# Patient Record
Sex: Male | Born: 1985 | Race: Black or African American | Hispanic: No | Marital: Single | Smoking: Current every day smoker
Health system: Southern US, Community
[De-identification: ages and names within clinical notes are randomized; demographics above are authoritative.]

## PROBLEM LIST (undated history)

## (undated) DIAGNOSIS — G8929 Other chronic pain: Secondary | ICD-10-CM

## (undated) DIAGNOSIS — M545 Low back pain, unspecified: Secondary | ICD-10-CM

## (undated) DIAGNOSIS — A481 Legionnaires' disease: Secondary | ICD-10-CM

## (undated) DIAGNOSIS — K219 Gastro-esophageal reflux disease without esophagitis: Secondary | ICD-10-CM

## (undated) DIAGNOSIS — J189 Pneumonia, unspecified organism: Secondary | ICD-10-CM

---

## 2005-01-25 ENCOUNTER — Emergency Department (HOSPITAL_COMMUNITY): Admission: EM | Admit: 2005-01-25 | Discharge: 2005-01-25 | Payer: Self-pay | Admitting: Emergency Medicine

## 2005-01-30 ENCOUNTER — Emergency Department (HOSPITAL_COMMUNITY): Admission: EM | Admit: 2005-01-30 | Discharge: 2005-01-30 | Payer: Self-pay | Admitting: *Deleted

## 2005-06-15 ENCOUNTER — Emergency Department (HOSPITAL_COMMUNITY): Admission: EM | Admit: 2005-06-15 | Discharge: 2005-06-15 | Payer: Self-pay | Admitting: Emergency Medicine

## 2005-06-18 ENCOUNTER — Emergency Department (HOSPITAL_COMMUNITY): Admission: EM | Admit: 2005-06-18 | Discharge: 2005-06-18 | Payer: Self-pay | Admitting: *Deleted

## 2005-12-25 ENCOUNTER — Emergency Department (HOSPITAL_COMMUNITY): Admission: EM | Admit: 2005-12-25 | Discharge: 2005-12-25 | Payer: Self-pay | Admitting: Family Medicine

## 2007-09-13 ENCOUNTER — Emergency Department (HOSPITAL_COMMUNITY): Admission: EM | Admit: 2007-09-13 | Discharge: 2007-09-13 | Payer: Self-pay | Admitting: Family Medicine

## 2007-10-07 ENCOUNTER — Emergency Department (HOSPITAL_COMMUNITY): Admission: EM | Admit: 2007-10-07 | Discharge: 2007-10-07 | Payer: Self-pay | Admitting: Emergency Medicine

## 2008-03-22 ENCOUNTER — Emergency Department (HOSPITAL_COMMUNITY): Admission: EM | Admit: 2008-03-22 | Discharge: 2008-03-22 | Payer: Self-pay | Admitting: Emergency Medicine

## 2008-06-08 ENCOUNTER — Emergency Department (HOSPITAL_COMMUNITY): Admission: EM | Admit: 2008-06-08 | Discharge: 2008-06-08 | Payer: Self-pay | Admitting: Emergency Medicine

## 2008-06-28 ENCOUNTER — Emergency Department (HOSPITAL_COMMUNITY): Admission: EM | Admit: 2008-06-28 | Discharge: 2008-06-28 | Payer: Self-pay | Admitting: Emergency Medicine

## 2008-07-16 ENCOUNTER — Emergency Department (HOSPITAL_COMMUNITY): Admission: EM | Admit: 2008-07-16 | Discharge: 2008-07-16 | Payer: Self-pay | Admitting: Emergency Medicine

## 2008-09-06 ENCOUNTER — Emergency Department (HOSPITAL_COMMUNITY): Admission: EM | Admit: 2008-09-06 | Discharge: 2008-09-06 | Payer: Self-pay | Admitting: Emergency Medicine

## 2009-08-26 ENCOUNTER — Emergency Department (HOSPITAL_COMMUNITY): Admission: EM | Admit: 2009-08-26 | Discharge: 2009-08-26 | Payer: Self-pay | Admitting: Family Medicine

## 2009-11-07 ENCOUNTER — Emergency Department (HOSPITAL_COMMUNITY): Admission: EM | Admit: 2009-11-07 | Discharge: 2009-11-07 | Payer: Self-pay | Admitting: Emergency Medicine

## 2009-11-29 ENCOUNTER — Emergency Department (HOSPITAL_COMMUNITY): Admission: EM | Admit: 2009-11-29 | Discharge: 2009-11-29 | Payer: Self-pay | Admitting: Family Medicine

## 2010-07-15 NOTE — H&P (Signed)
Louis Hoffman, Louis Hoffman NO.:  [id number]   MEDICAL RECORD NO.:  [id number]          PATIENT TYPE:  EMS   LOCATION:  ED                           FACILITY:  [facility]   PHYSICIAN:  Karie Chimera, P.A.-C.DATE OF BIRTH:  May 03, 1985   DATE OF ADMISSION:  03/22/2008  DATE OF DISCHARGE:  03/22/2008                              HISTORY & PHYSICAL   Mr. Brawn is a pleasant 25 year old right-hand gentleman who presented  to [facility] Emergency Room for injury sustained to his left thumb  earlier this morning.  He was wrestling with his brother when he  sustained a subluxation dislocation of the left thumb.  He presented to  the [facility] Emergency Room for evaluation.  We were contacted by the  emergency room staff for consultation.  He denies any other injury.  He  recently ate at 2 p.m. while in the ER setting.   PAST MEDICAL HISTORY:  Healthy.   SURGICAL HISTORY:  None.   FAMILY HISTORY:  Noncontributory.   SOCIAL HISTORY:  He is unwed.  He has no children.  He smokes half a  pack per day.  Denies any alcohol use.  He occasionally uses marijuana.   DRUG ALLERGIES:  None.   MEDICATIONS:  None.   REVIEW OF SYSTEMS:  Negative.   PHYSICAL EXAMINATION:  CONSTITUTIONAL:  Pleasant gentleman no acute  distress.  VITAL SIGNS:  Stable.  He is afebrile.  HEENT: Within the needed limits.  CHEST:  Clear to auscultation bilaterally.  ABDOMEN:  Abdomen is soft, nontender.  Bowel sounds positive.  HEART:  S1, S2.  EXTREMITIES:  Evaluation of the left upper extremity shows he has soft  tissue swelling about the IP joint of left thumb with deformity present.  His sensation and refill are intact and DTR difficult to assess given  the subluxing dislocation present as viewed per prior radiographs.  MP  and CMC nontender.  Remaining digits are within normal limits.   Radiographs are reviewed and showed subluxation/dislocation, left thumb  IP in a posterior radial fashion.   ASSESSMENT:  Left thumb interphalangeal subluxation dislocation.   PLAN:  We discussed with the patient the need for an attempted  reduction.  If this remains unstable, possible pinning would be  recommended.  Thus, after obtaining verbal consent, digital block was  performed to the left thumb and appropriate anesthetic was obtained.  Gentle reduction was implemented.  The patient was reduced without  difficulties and appeared stable.  Thus, he was placed in the dorsal  blocking splint and a radial blocking splint followed by casting.  Post  reduction films showed reduction of the IP joint.   We discussed with him diligent elevation, keeping his cast clean and dry  and intact, and not giving this wet.  In 1 week he will call us for  repeat radiographs and follow up this in 1 week at [phone].  We have  written for Vicodin 5/500 one to two p.o. 4 to 6 p.r.n. pain #30.  All  questions were encouraged and answered.      Karie Chimera, P.A.-C.  BB/MEDQ  D:  03/22/2008  T:  03/22/2008  Job:  [id number]   cc:   Dionne Ano. Amanda Pea, M.D.  Fax: [phone]

## 2013-07-09 ENCOUNTER — Encounter (HOSPITAL_COMMUNITY): Payer: Self-pay | Admitting: Emergency Medicine

## 2013-07-09 ENCOUNTER — Emergency Department (HOSPITAL_COMMUNITY)
Admission: EM | Admit: 2013-07-09 | Discharge: 2013-07-09 | Disposition: A | Discharge status: Home / Self Care | Payer: Self-pay | Attending: Emergency Medicine | Admitting: Emergency Medicine

## 2013-07-09 DIAGNOSIS — J020 Streptococcal pharyngitis: Secondary | ICD-10-CM | POA: Insufficient documentation

## 2013-07-09 DIAGNOSIS — F17200 Nicotine dependence, unspecified, uncomplicated: Secondary | ICD-10-CM | POA: Insufficient documentation

## 2013-07-09 MED ORDER — METHYLPREDNISOLONE SODIUM SUCC 125 MG IJ SOLR
125.0000 mg | Freq: Once | INTRAMUSCULAR | Status: AC
  Administered 2013-07-09: 125 mg via INTRAMUSCULAR
  Filled 2013-07-09: qty 2

## 2013-07-09 MED ORDER — PENICILLIN G BENZATHINE 1200000 UNIT/2ML IM SUSP
1.2000 10*6.[IU] | Freq: Once | INTRAMUSCULAR | Status: AC
  Administered 2013-07-09: 1.2 10*6.[IU] via INTRAMUSCULAR
  Filled 2013-07-09: qty 2

## 2013-07-09 NOTE — ED Notes (Signed)
Pt c/o sore throat x 3 days, coughing up a lot of spit per pt.

## 2013-07-09 NOTE — Discharge Instructions (Signed)
You have been treated for step throat.  Symptoms should start to improve in the next few days. May use over the counter chloraseptic spray and/or salt water gargles to help with pain. Return to the ED for new concerns.

## 2013-07-09 NOTE — ED Provider Notes (Signed)
Medical screening examination/treatment/procedure(s) were performed by non-physician practitioner and as supervising physician I was immediately available for consultation/collaboration.   EKG Interpretation None        Lekeya Rollings, MD 07/09/13 1413 

## 2013-07-09 NOTE — ED Provider Notes (Signed)
CSN: [id number]     Arrival date & time 07/09/13  1218 History  This chart was scribed for non-physician practitioner, Sharilyn SitesLisa Jumaane Weatherford, PA-C,working with Rolan BuccoMelanie Belfi, MD, by Karle PlumberJennifer Tensley, ED Scribe.  This patient was seen in room WTR6/WTR6 and the patient's care was started at 12:41 PM.  Chief Complaint  Patient presents with  . Sore Throat   The history is provided by the patient. No language interpreter was used.   HPI Comments:  Louis FantasiaJohnnie Hoffman is a 28 y.o. male who presents to the Emergency Department complaining of sore throat, cold sweats, and pain with swallowing. He reports associated subjective fever, cough, rhinorrhea, and nasal congestion. He states he has not had any sick contacts to his knowledge. He denies chills, difficulty swallowing, or difficulty breathing.   History reviewed. No pertinent past medical history. History reviewed. No pertinent past surgical history. No family history on file. History  Substance Use Topics  . Smoking status: Current Every Day Smoker  . Smokeless tobacco: Not on file  . Alcohol Use: Yes    Review of Systems  Constitutional: Positive for fever (subjective). Negative for chills.  HENT: Positive for congestion, rhinorrhea and sore throat. Negative for trouble swallowing.   Respiratory: Positive for cough. Negative for shortness of breath.   All other systems reviewed and are negative.   Allergies  Review of patient's allergies indicates no known allergies.  Home Medications   Prior to Admission medications   Not on File   Triage Vitals: BP 140/96  Pulse 93  Temp(Src) 98.9 F (37.2 C) (Oral)  Resp 16  SpO2 98% Physical Exam  Nursing note and vitals reviewed. Constitutional: He is oriented to person, place, and time. He appears well-developed and well-nourished.  HENT:  Head: Normocephalic and atraumatic.  Mouth/Throat: Uvula is midline and mucous membranes are normal. Posterior oropharyngeal erythema present.  Tonsils 2+  bilaterally with exudate. Uvula midline. No peritonsillar abscess. Handling secretions appropriately.  No difficulty swallowing or speaking.  Phonation normal  Eyes: Conjunctivae and EOM are normal. Pupils are equal, round, and reactive to light.  Neck: Trachea normal, normal range of motion and phonation normal.  Cardiovascular: Normal rate, regular rhythm and normal heart sounds.   Pulmonary/Chest: Effort normal and breath sounds normal.  Abdominal: Soft. Bowel sounds are normal.  Musculoskeletal: Normal range of motion.  Lymphadenopathy:    He has cervical adenopathy (anterior chains bilaterally).  Neurological: He is alert and oriented to person, place, and time.  Skin: Skin is warm and dry.  Psychiatric: He has a normal mood and affect.    ED Course  Procedures (including critical care time) DIAGNOSTIC STUDIES: Oxygen Saturation is 98% on RA, normal by my interpretation.   COORDINATION OF CARE: 12:43 PM- Will give PCN injection for strep throat. Pt verbalizes understanding and agrees to plan.  Medications - No data to display  Labs Review Labs Reviewed - No data to display  Imaging Review No results found.   EKG Interpretation None      MDM   Final diagnoses:  Strep pharyngitis   Sign/sx consistent with streph pharyngitis.  Pt is currently afebrile and non-toxic appearing.  His airway is patent and there are no signs/sx concerning for PTA at this time.  Pt tx in ED with solu-medrol and bicillin.  Advised may use OTC chloraseptic spray and/or salt water gargles to help with throat pain.  Discussed plan with patient, he/she acknowledged understanding and agreed with plan of care.  Return precautions given  for new or worsening symptoms.  I personally performed the services described in this documentation, which was scribed in my presence. The recorded information has been reviewed and is accurate.  Garlon HatchetLisa M Sima Lindenberger, PA-C 07/09/13 1323

## 2015-02-07 ENCOUNTER — Encounter (HOSPITAL_COMMUNITY): Payer: Self-pay | Admitting: *Deleted

## 2015-02-07 ENCOUNTER — Emergency Department (HOSPITAL_COMMUNITY)
Admission: EM | Admit: 2015-02-07 | Discharge: 2015-02-07 | Disposition: A | Discharge status: Home / Self Care | Payer: Self-pay | Attending: Emergency Medicine | Admitting: Emergency Medicine

## 2015-02-07 DIAGNOSIS — A64 Unspecified sexually transmitted disease: Secondary | ICD-10-CM | POA: Insufficient documentation

## 2015-02-07 DIAGNOSIS — F17210 Nicotine dependence, cigarettes, uncomplicated: Secondary | ICD-10-CM | POA: Insufficient documentation

## 2015-02-07 DIAGNOSIS — R2232 Localized swelling, mass and lump, left upper limb: Secondary | ICD-10-CM | POA: Insufficient documentation

## 2015-02-07 DIAGNOSIS — R61 Generalized hyperhidrosis: Secondary | ICD-10-CM | POA: Insufficient documentation

## 2015-02-07 MED ORDER — FLUORESCEIN SODIUM 1 MG OP STRP
1.0000 | ORAL_STRIP | Freq: Once | OPHTHALMIC | Status: AC
  Administered 2015-02-07: 1 via OPHTHALMIC
  Filled 2015-02-07: qty 1

## 2015-02-07 MED ORDER — PROPARACAINE HCL 0.5 % OP SOLN
1.0000 [drp] | Freq: Once | OPHTHALMIC | Status: AC
  Administered 2015-02-07: 1 [drp] via OPHTHALMIC
  Filled 2015-02-07: qty 15

## 2015-02-07 MED ORDER — ACYCLOVIR 400 MG PO TABS: 800.0000 mg | ORAL_TABLET | Freq: Two times a day (BID) | ORAL | Status: DC

## 2015-02-07 NOTE — ED Notes (Signed)
Declined W/C at D/C and was escorted to lobby by RN. 

## 2015-02-07 NOTE — ED Notes (Signed)
SEE PA assessment 

## 2015-02-07 NOTE — Discharge Instructions (Signed)
Please take your medications as prescribed. It is important to inform your sexual partners be retreated for STI today. Follow-up at the health department for further evaluation mention of your symptoms as well as follow-up on STI testing. Return to ED for any new or worsening symptoms.  Sexually Transmitted Disease A sexually transmitted disease (STD) is a disease or infection that may be passed (transmitted) from person to person, usually during sexual activity. This may happen by way of saliva, semen, blood, vaginal mucus, or urine. Common STDs include:  Gonorrhea.  Chlamydia.  Syphilis.  HIV and AIDS.  Genital herpes.  Hepatitis B and C.  Trichomonas.  Human papillomavirus (HPV).  Pubic lice.  Scabies.  Mites.  Bacterial vaginosis. WHAT ARE CAUSES OF STDs? An STD may be caused by bacteria, a virus, or parasites. STDs are often transmitted during sexual activity if one person is infected. However, they may also be transmitted through nonsexual means. STDs may be transmitted after:   Sexual intercourse with an infected person.  Sharing sex toys with an infected person.  Sharing needles with an infected person or using unclean piercing or tattoo needles.  Having intimate contact with the genitals, mouth, or rectal areas of an infected person.  Exposure to infected fluids during birth. WHAT ARE THE SIGNS AND SYMPTOMS OF STDs? Different STDs have different symptoms. Some people may not have any symptoms. If symptoms are present, they may include:  Painful or bloody urination.  Pain in the pelvis, abdomen, vagina, anus, throat, or eyes.  A skin rash, itching, or irritation.  Growths, ulcerations, blisters, or sores in the genital and anal areas.  Abnormal vaginal discharge with or without bad odor.  Penile discharge in men.  Fever.  Pain or bleeding during sexual intercourse.  Swollen glands in the groin area.  Yellow skin and eyes (jaundice). This is seen  with hepatitis.  Swollen testicles.  Infertility.  Sores and blisters in the mouth. HOW ARE STDs DIAGNOSED? To make a diagnosis, your health care provider may:  Take a medical history.  Perform a physical exam.  Take a sample of any discharge to examine.  Swab the throat, cervix, opening to the penis, rectum, or vagina for testing.  Test a sample of your first morning urine.  Perform blood tests.  Perform a Pap test, if this applies.  Perform a colposcopy.  Perform a laparoscopy. HOW ARE STDs TREATED? Treatment depends on the STD. Some STDs may be treated but not cured.  Chlamydia, gonorrhea, trichomonas, and syphilis can be cured with antibiotic medicine.  Genital herpes, hepatitis, and HIV can be treated, but not cured, with prescribed medicines. The medicines lessen symptoms.  Genital warts from HPV can be treated with medicine or by freezing, burning (electrocautery), or surgery. Warts may come back.  HPV cannot be cured with medicine or surgery. However, abnormal areas may be removed from the cervix, vagina, or vulva.  If your diagnosis is confirmed, your recent sexual partners need treatment. This is true even if they are symptom-free or have a negative culture or evaluation. They should not have sex until their health care providers say it is okay.  Your health care provider may test you for infection again 3 months after treatment. HOW CAN I REDUCE MY RISK OF GETTING AN STD? Take these steps to reduce your risk of getting an STD:  Use latex condoms, dental dams, and water-soluble lubricants during sexual activity. Do not use petroleum jelly or oils.  Avoid having multiple sex  partners.  Do not have sex with someone who has other sex partners  Do not have sex with anyone you do not know or who is at high risk for an STD.  Avoid risky sex practices that can break your skin.  Do not have sex if you have open sores on your mouth or skin.  Avoid drinking too  much alcohol or taking illegal drugs. Alcohol and drugs can affect your judgment and put you in a vulnerable position.  Avoid engaging in oral and anal sex acts.  Get vaccinated for HPV and hepatitis. If you have not received these vaccines in the past, talk to your health care provider about whether one or both might be right for you.  If you are at risk of being infected with HIV, it is recommended that you take a prescription medicine daily to prevent HIV infection. This is called pre-exposure prophylaxis (PrEP). You are considered at risk if:  You are a man who has sex with other men (MSM).  You are a heterosexual man or woman and are sexually active with more than one partner.  You take drugs by injection.  You are sexually active with a partner who has HIV.  Talk with your health care provider about whether you are at high risk of being infected with HIV. If you choose to begin PrEP, you should first be tested for HIV. You should then be tested every 3 months for as long as you are taking PrEP. WHAT SHOULD I DO IF I THINK I HAVE AN STD?  See your health care provider.  Tell your sexual partner(s). They should be tested and treated for any STDs.  Do not have sex until your health care provider says it is okay. WHEN SHOULD I GET IMMEDIATE MEDICAL CARE? Contact your health care provider right away if:   You have severe abdominal pain.  You are a man and notice swelling or pain in your testicles.  You are a woman and notice swelling or pain in your vagina.   This information is not intended to replace advice given to you by your health care provider. Make sure you discuss any questions you have with your health care provider.   Document Released: 05/09/2002 Document Revised: 03/09/2014 Document Reviewed: 09/06/2012 Elsevier Interactive Patient Education [employer]2016 Elsevier Inc.

## 2015-02-07 NOTE — ED Notes (Signed)
Pt reports he has a rash to groin area and general body. Pt reports he has night sweats  for one week.

## 2015-02-07 NOTE — ED Provider Notes (Signed)
CSN: [id number]     Arrival date & time 02/07/15  9562 History  By signing my name below, I, Doreatha Martin, attest that this documentation has been prepared under the direction and in the presence of  Joycie Peek, PA-C. Electronically Signed: Doreatha Martin, ED Scribe. 02/07/2015. 10:14 AM.    Chief Complaint  Patient presents with  . Rash   The history is provided by the patient. No language interpreter was used.    HPI Comments: Louis Hoffman is a 29 y.o. male who presents to the Emergency Department complaining of intermittent, pruritic, burning painful rash to the BUE and groin onset 2 days ago with associated night sweats for 2 weeks, nonpainful area of swelling to the left wrist. He also notes a small area of rash proximal to his left eye and a throbbing sensation to his arms where the rash is located. Pt states no new recent sexual partners. Pt states that he has tried Benadryl with no relief of rash. Pt states h/o similar rash. He denies rash to the palm of the hands or soles of the feet, penile discharge, penile pain, abdominal pain, SOB, lymph node swelling.   History reviewed. No pertinent past medical history. History reviewed. No pertinent past surgical history. History reviewed. No pertinent family history. Social History  Substance Use Topics  . Smoking status: Current Every Day Smoker  . Smokeless tobacco: None  . Alcohol Use: Yes    Review of Systems A 10 point review of systems was completed and was negative except for pertinent positives and negatives as mentioned in the history of present illness.   Allergies  Review of patient's allergies indicates no known allergies.  Home Medications   Prior to Admission medications   Medication Sig Start Date End Date Taking? Authorizing Provider  DM-Phenylephrine-Acetaminophen (VICKS DAYQUIL COLD & FLU) 10-5-325 MG CAPS Take 2 capsules by mouth every 6 (six) hours as needed (for cold).   Yes Historical Provider, MD  ibuprofen  (ADVIL,MOTRIN) 200 MG tablet Take 400-600 mg by mouth every 6 (six) hours as needed for fever or moderate pain.   Yes Historical Provider, MD  acyclovir (ZOVIRAX) 400 MG tablet Take 2 tablets (800 mg total) by mouth 2 (two) times daily. 02/07/15   Yazid Pop, PA-C   BP 146/88 mmHg  Pulse 72  Temp(Src) 98 F (36.7 C) (Oral)  Resp 16  Wt 113.399 kg  SpO2 99% Physical Exam  Constitutional: He is oriented to person, place, and time. He appears well-developed and well-nourished.  Awake, alert, nontoxic appearance.    HENT:  Head: Normocephalic and atraumatic.  Eyes: Conjunctivae and EOM are normal. Pupils are equal, round, and reactive to light.  Small lesion near the medial canthus of left eyelid. Nondraining, slightly erosive, No dendritic lesions or other abnormalities noted on fluorescein staining  Neck: Normal range of motion. Neck supple.  Cardiovascular: Normal rate.   Pulmonary/Chest: Effort normal. No respiratory distress.  Abdominal: He exhibits no distension.  Genitourinary:  Chaperone present throughout entire exam.  3 small areas of lightly erythematous versus skin colored lesions. Papular and appears slightly erosive. No obvious vesicles. No lesions to the glans. No inguinal lymphadenopathy  Musculoskeletal: Normal range of motion.  Neurological: He is alert and oriented to person, place, and time.  Skin: Skin is warm and dry. Rash noted.  Non-tender nodule on dorsum of left wrist. Non pulsatile. No erythema. No fluctuance.  Several other sparsely located lesion similar to lesions on genitalia. No rashes to  mucosa, palms or soles.  Psychiatric: He has a normal mood and affect. His behavior is normal.  Nursing note and vitals reviewed.  ED Course  Procedures (including critical care time) DIAGNOSTIC STUDIES: Oxygen Saturation is 96% on RA, adequate by my interpretation.    COORDINATION OF CARE: 10:09 AM Discussed treatment plan with pt at bedside and pt agreed to  plan. Plan for fluorescein staining, urinalysis and treatment for STI and advised f/u with the health department.    Labs Review Labs Reviewed  RPR  HIV ANTIBODY (ROUTINE TESTING)    I have personally reviewed and evaluated these lab results as part of my medical decision-making.  Meds given in ED:  Medications  fluorescein ophthalmic strip 1 strip (1 strip Right Eye Given 02/07/15 1034)  proparacaine (ALCAINE) 0.5 % ophthalmic solution 1 drop (1 drop Both Eyes Given 02/07/15 1051)    Discharge Medication List as of 02/07/2015 11:10 AM    START taking these medications   Details  acyclovir (ZOVIRAX) 400 MG tablet Take 2 tablets (800 mg total) by mouth 2 (two) times daily., Starting 02/07/2015, Until Discontinued, Print         Filed Vitals:   02/07/15 0954 02/07/15 1113  BP: 148/93 146/88  Pulse: 79 72  Temp: 97.5 F (36.4 C) 98 F (36.7 C)  Resp: 17 16    MDM   Final diagnoses:  STI (sexually transmitted infection)   Patient with rash to BUE and groin without penile discharge or pain. Patient treated in the ED for STI with acyclovir. Also performed fluorescein stain exam for herpetic lesions in left eye. Negative. Patient advised to f/u with health department for further evaluation of symptoms and future STI check. Patient advised to inform and treat all sexual partners.  Pt advised on safe sex practices and understands.  HIV and RPR sent. No concern for prostatitis or epididymitis. Discussed return precautions. Pt appears safe for discharge.    I personally performed the services described in this documentation, which was scribed in my presence. The recorded information has been reviewed and is accurate.   Joycie PeekBenjamin Rayna Brenner, PA-C 02/07/15 1559  Alvira MondayErin Schlossman, MD 02/08/15 [phone]

## 2015-02-08 LAB — RPR: RPR: NONREACTIVE

## 2015-02-08 LAB — HIV ANTIBODY (ROUTINE TESTING W REFLEX): HIV Screen 4th Generation wRfx: NONREACTIVE

## 2016-08-10 ENCOUNTER — Emergency Department (HOSPITAL_COMMUNITY): Payer: Self-pay

## 2016-08-10 ENCOUNTER — Inpatient Hospital Stay (HOSPITAL_COMMUNITY)
Admission: EM | Admit: 2016-08-10 | Discharge: 2016-08-13 | DRG: 871 | Discharge status: Against Medical Advice | Payer: Self-pay | Attending: Family Medicine | Admitting: Family Medicine

## 2016-08-10 ENCOUNTER — Encounter (HOSPITAL_COMMUNITY): Payer: Self-pay

## 2016-08-10 DIAGNOSIS — F17210 Nicotine dependence, cigarettes, uncomplicated: Secondary | ICD-10-CM | POA: Diagnosis present

## 2016-08-10 DIAGNOSIS — R55 Syncope and collapse: Secondary | ICD-10-CM | POA: Diagnosis present

## 2016-08-10 DIAGNOSIS — Z79899 Other long term (current) drug therapy: Secondary | ICD-10-CM

## 2016-08-10 DIAGNOSIS — J189 Pneumonia, unspecified organism: Secondary | ICD-10-CM

## 2016-08-10 DIAGNOSIS — E876 Hypokalemia: Secondary | ICD-10-CM | POA: Diagnosis present

## 2016-08-10 DIAGNOSIS — F1210 Cannabis abuse, uncomplicated: Secondary | ICD-10-CM

## 2016-08-10 DIAGNOSIS — D72829 Elevated white blood cell count, unspecified: Secondary | ICD-10-CM

## 2016-08-10 DIAGNOSIS — J181 Lobar pneumonia, unspecified organism: Secondary | ICD-10-CM

## 2016-08-10 DIAGNOSIS — A481 Legionnaires' disease: Secondary | ICD-10-CM | POA: Diagnosis present

## 2016-08-10 DIAGNOSIS — Z6841 Body Mass Index (BMI) 40.0 and over, adult: Secondary | ICD-10-CM

## 2016-08-10 DIAGNOSIS — A419 Sepsis, unspecified organism: Principal | ICD-10-CM

## 2016-08-10 DIAGNOSIS — E871 Hypo-osmolality and hyponatremia: Secondary | ICD-10-CM

## 2016-08-10 DIAGNOSIS — M549 Dorsalgia, unspecified: Secondary | ICD-10-CM

## 2016-08-10 DIAGNOSIS — R1110 Vomiting, unspecified: Secondary | ICD-10-CM

## 2016-08-10 DIAGNOSIS — R17 Unspecified jaundice: Secondary | ICD-10-CM | POA: Diagnosis present

## 2016-08-10 DIAGNOSIS — Z8249 Family history of ischemic heart disease and other diseases of the circulatory system: Secondary | ICD-10-CM

## 2016-08-10 DIAGNOSIS — M545 Low back pain: Secondary | ICD-10-CM | POA: Diagnosis present

## 2016-08-10 DIAGNOSIS — Z720 Tobacco use: Secondary | ICD-10-CM

## 2016-08-10 HISTORY — DX: Pneumonia, unspecified organism: J18.9

## 2016-08-10 LAB — LIPASE, BLOOD: Lipase: 20 U/L (ref 11–51)

## 2016-08-10 LAB — CBC
HCT: 38.1 % — ABNORMAL LOW (ref 39.0–52.0)
HEMOGLOBIN: 13.3 g/dL (ref 13.0–17.0)
MCH: 30.8 pg (ref 26.0–34.0)
MCHC: 34.9 g/dL (ref 30.0–36.0)
MCV: 88.2 fL (ref 78.0–100.0)
Platelets: 172 10*3/uL (ref 150–400)
RBC: 4.32 MIL/uL (ref 4.22–5.81)
RDW: 12.2 % (ref 11.5–15.5)
WBC: 19.1 10*3/uL — ABNORMAL HIGH (ref 4.0–10.5)

## 2016-08-10 LAB — URINALYSIS, ROUTINE W REFLEX MICROSCOPIC
Bacteria, UA: NONE SEEN
Bilirubin Urine: NEGATIVE
GLUCOSE, UA: NEGATIVE mg/dL
HGB URINE DIPSTICK: NEGATIVE
KETONES UR: NEGATIVE mg/dL
Leukocytes, UA: NEGATIVE
NITRITE: NEGATIVE
PROTEIN: 100 mg/dL — AB
Specific Gravity, Urine: 1.027 (ref 1.005–1.030)
pH: 5 (ref 5.0–8.0)

## 2016-08-10 LAB — INFLUENZA PANEL BY PCR (TYPE A & B)
INFLBPCR: NEGATIVE
Influenza A By PCR: NEGATIVE

## 2016-08-10 LAB — COMPREHENSIVE METABOLIC PANEL
ALK PHOS: 95 U/L (ref 38–126)
ALT: 47 U/L (ref 17–63)
ANION GAP: 12 (ref 5–15)
AST: 39 U/L (ref 15–41)
Albumin: 3.8 g/dL (ref 3.5–5.0)
BILIRUBIN TOTAL: 3.1 mg/dL — AB (ref 0.3–1.2)
BUN: 9 mg/dL (ref 6–20)
CALCIUM: 8.9 mg/dL (ref 8.9–10.3)
CO2: 19 mmol/L — ABNORMAL LOW (ref 22–32)
Chloride: 101 mmol/L (ref 101–111)
Creatinine, Ser: 1.2 mg/dL (ref 0.61–1.24)
GFR calc non Af Amer: >60 mL/min (ref >60)
Glucose, Bld: 133 mg/dL — ABNORMAL HIGH (ref 65–99)
Potassium: 3.2 mmol/L — ABNORMAL LOW (ref 3.5–5.1)
SODIUM: 132 mmol/L — AB (ref 135–145)
TOTAL PROTEIN: 7.3 g/dL (ref 6.5–8.1)

## 2016-08-10 LAB — I-STAT CG4 LACTIC ACID, ED: Lactic Acid, Venous: 0.83 mmol/L (ref 0.5–1.9)

## 2016-08-10 LAB — PROCALCITONIN: Procalcitonin: 0.25 ng/mL

## 2016-08-10 LAB — RAPID URINE DRUG SCREEN, HOSP PERFORMED
Amphetamines: NOT DETECTED
Barbiturates: NOT DETECTED
Benzodiazepines: NOT DETECTED
Cocaine: NOT DETECTED
OPIATES: NOT DETECTED
Tetrahydrocannabinol: POSITIVE — AB

## 2016-08-10 LAB — ETHANOL: Alcohol, Ethyl (B): <5 mg/dL (ref <5)

## 2016-08-10 LAB — STREP PNEUMONIAE URINARY ANTIGEN: Strep Pneumo Urinary Antigen: NEGATIVE

## 2016-08-10 LAB — LACTIC ACID, PLASMA: LACTIC ACID, VENOUS: 0.9 mmol/L (ref 0.5–1.9)

## 2016-08-10 MED ORDER — ONDANSETRON HCL 4 MG/2ML IJ SOLN
4.0000 mg | Freq: Once | INTRAMUSCULAR | Status: AC
  Administered 2016-08-10: 4 mg via INTRAVENOUS
  Filled 2016-08-10: qty 2

## 2016-08-10 MED ORDER — ACETAMINOPHEN 650 MG RE SUPP: 650.0000 mg | Freq: Four times a day (QID) | RECTAL | Status: DC | PRN

## 2016-08-10 MED ORDER — ONDANSETRON HCL 4 MG/2ML IJ SOLN
4.0000 mg | Freq: Four times a day (QID) | INTRAMUSCULAR | Status: DC | PRN
  Administered 2016-08-10: 4 mg via INTRAVENOUS
  Filled 2016-08-10: qty 2

## 2016-08-10 MED ORDER — VANCOMYCIN HCL IN DEXTROSE 1-5 GM/200ML-% IV SOLN: 1000.0000 mg | Freq: Once | INTRAVENOUS | Status: DC

## 2016-08-10 MED ORDER — VANCOMYCIN HCL IN DEXTROSE 1-5 GM/200ML-% IV SOLN
1000.0000 mg | Freq: Two times a day (BID) | INTRAVENOUS | Status: DC
  Administered 2016-08-11: 1000 mg via INTRAVENOUS
  Filled 2016-08-10 (×2): qty 200

## 2016-08-10 MED ORDER — SODIUM CHLORIDE 0.9 % IV BOLUS (SEPSIS)
1000.0000 mL | Freq: Once | INTRAVENOUS | Status: AC
  Administered 2016-08-10: 1000 mL via INTRAVENOUS

## 2016-08-10 MED ORDER — ACETAMINOPHEN 500 MG PO TABS
500.0000 mg | ORAL_TABLET | Freq: Once | ORAL | Status: AC
  Administered 2016-08-10: 500 mg via ORAL
  Filled 2016-08-10: qty 1

## 2016-08-10 MED ORDER — PIPERACILLIN-TAZOBACTAM 3.375 G IVPB 30 MIN
3.3750 g | Freq: Once | INTRAVENOUS | Status: AC
  Administered 2016-08-10: 3.375 g via INTRAVENOUS
  Filled 2016-08-10: qty 50

## 2016-08-10 MED ORDER — HYDROMORPHONE HCL 1 MG/ML IJ SOLN
1.0000 mg | Freq: Once | INTRAMUSCULAR | Status: AC
  Administered 2016-08-10: 1 mg via INTRAVENOUS
  Filled 2016-08-10: qty 1

## 2016-08-10 MED ORDER — SODIUM CHLORIDE 0.9 % IV SOLN
INTRAVENOUS | Status: DC
  Administered 2016-08-10: 10:00:00 via INTRAVENOUS

## 2016-08-10 MED ORDER — HYDRALAZINE HCL 20 MG/ML IJ SOLN
10.0000 mg | Freq: Four times a day (QID) | INTRAMUSCULAR | Status: DC | PRN
  Filled 2016-08-10: qty 0.5

## 2016-08-10 MED ORDER — ONDANSETRON HCL 4 MG PO TABS: 4.0000 mg | ORAL_TABLET | Freq: Four times a day (QID) | ORAL | Status: DC | PRN

## 2016-08-10 MED ORDER — VANCOMYCIN HCL 10 G IV SOLR
2000.0000 mg | Freq: Once | INTRAVENOUS | Status: AC
  Administered 2016-08-10: 2000 mg via INTRAVENOUS
  Filled 2016-08-10: qty 2000

## 2016-08-10 MED ORDER — IOPAMIDOL (ISOVUE-300) INJECTION 61%
100.0000 mL | Freq: Once | INTRAVENOUS | Status: AC | PRN
  Administered 2016-08-10: 100 mL via INTRAVENOUS

## 2016-08-10 MED ORDER — ACETAMINOPHEN 325 MG PO TABS
650.0000 mg | ORAL_TABLET | Freq: Four times a day (QID) | ORAL | Status: DC | PRN
  Administered 2016-08-10 – 2016-08-11 (×3): 650 mg via ORAL
  Filled 2016-08-10 (×4): qty 2

## 2016-08-10 MED ORDER — SODIUM CHLORIDE 0.9 % IV SOLN
INTRAVENOUS | Status: DC
  Administered 2016-08-10: 23:00:00 via INTRAVENOUS

## 2016-08-10 MED ORDER — ACETAMINOPHEN 500 MG PO TABS
1000.0000 mg | ORAL_TABLET | Freq: Once | ORAL | Status: AC
  Administered 2016-08-10: 1000 mg via ORAL
  Filled 2016-08-10: qty 2

## 2016-08-10 MED ORDER — HYDROMORPHONE HCL 1 MG/ML IJ SOLN
1.0000 mg | Freq: Four times a day (QID) | INTRAMUSCULAR | Status: DC | PRN
  Administered 2016-08-10 – 2016-08-12 (×7): 1 mg via INTRAVENOUS
  Filled 2016-08-10 (×7): qty 1

## 2016-08-10 MED ORDER — POTASSIUM CHLORIDE 10 MEQ/100ML IV SOLN
10.0000 meq | INTRAVENOUS | Status: AC
  Administered 2016-08-10 (×3): 10 meq via INTRAVENOUS
  Filled 2016-08-10 (×3): qty 100

## 2016-08-10 MED ORDER — PIPERACILLIN-TAZOBACTAM 3.375 G IVPB
3.3750 g | Freq: Three times a day (TID) | INTRAVENOUS | Status: DC
  Administered 2016-08-10 – 2016-08-12 (×5): 3.375 g via INTRAVENOUS
  Filled 2016-08-10 (×5): qty 50

## 2016-08-10 MED ORDER — IOPAMIDOL (ISOVUE-300) INJECTION 61%
INTRAVENOUS | Status: AC
  Filled 2016-08-10: qty 100

## 2016-08-10 NOTE — Progress Notes (Signed)
Pharmacy Antibiotic Note  Louis Hoffman is a 31 y.o. male admitted on 08/10/2016 with sepsis.  Pharmacy has been consulted for vanc/Zosyn dosing.  Plan: Vancomycin 2g loading dose then 1g  IV every 12 hours.  Goal trough 15-20 mcg/mL. Zosyn 3.375g IV q8h (4 hour infusion).  Height: 5\' 6"  (167.6 cm) Weight: 275 lb (124.7 kg) IBW/kg (Calculated) : 63.8  Temp (24hrs), Avg:101.1 F (38.4 C), Min:98.5 F (36.9 C), Max:103.3 F (39.6 C)   Recent Labs Lab 08/10/16 0957 08/10/16 1130  WBC 19.1*  --   CREATININE 1.20  --   LATICACIDVEN  --  0.83    Estimated Creatinine Clearance: 110.3 mL/min (by C-G formula based on SCr of 1.2 mg/dL).    No Known Allergies   Thank you for allowing pharmacy to be a part of this patient's care.   Hessie KnowsJustin M Ranulfo Kall, PharmD, BCPS Pager [phone] 08/10/2016 11:59 AM

## 2016-08-10 NOTE — ED Notes (Signed)
Patient transported to CT 

## 2016-08-10 NOTE — H&P (Signed)
History and Physical    Louis Hoffman:[id number] DOB: June 04, 1985 DOA: 08/10/2016  Referring MD/NP/PA: Dr. Rogene Houston  PCP: Patient, No Pcp Per    Patient coming from: home   Chief Complaint: vomiting, diarrhea  HPI: Louis Hoffman is a 31 y.o. male with no significant medical history who presented to ED with worsening mid abdominal pain, 10/10 in intensity at worst, intermittent and feels as cramps and discomfort for past 4-5 days prior to this admission, non radiating and associated with nausea and non bloody vomiting. Patient also has had several episodes of diarrhea, no blood with bowel movement. No chest pain, no shortness of breath and no palpitations. He did have a fever and chills. No lightheadedness. No urinary complaints.   ED Course: In ED, BP was 120/105, HR 113, RR 29, T max 103.3 F and normal oxygen saturation. Blood work showed WBC count 19.1, potassium 3.2 (supplemented in ED), normal lactic acid. CT abd done first to evaluate for N/V and no acute findings seen other than infiltrative changes in the colon possibly from remote IBD. What was also seen are pulm nodules so CT chest done and pt fpund to have large pneumonia on the right side. Sepsis criteria met so pt empirically started on vanco and zosyn while awaiting culture resutls   Review of Systems:  Constitutional: Positive for fever, chills, diaphoresis, no activity change, appetite change and fatigue.  HENT: Negative for ear pain, nosebleeds, congestion, facial swelling, rhinorrhea, neck pain, neck stiffness and ear discharge.   Eyes: Negative for pain, discharge, redness, itching and visual disturbance.  Respiratory: Negative for cough, choking, chest tightness, shortness of breath, wheezing and stridor.   Cardiovascular: Negative for chest pain, palpitations and leg swelling.  Gastrointestinal: Negative for abdominal distention.  Genitourinary: Negative for dysuria, urgency, frequency, hematuria, flank pain, decreased  urine volume, difficulty urinating and dyspareunia.  Musculoskeletal: Negative for back pain, joint swelling, arthralgias and gait problem.  Neurological: Negative for dizziness, tremors, seizures, syncope, facial asymmetry, speech difficulty, weakness, light-headedness, numbness and headaches.  Hematological: Negative for adenopathy. Does not bruise/bleed easily.  Psychiatric/Behavioral: Negative for hallucinations, behavioral problems, confusion, dysphoric mood, decreased concentration and agitation.   History reviewed. No pertinent past medical history.  History reviewed. No pertinent surgical history.  Social history:  reports that he has been smoking Cigarettes.  He has been smoking about 1.00 pack per day. He has never used smokeless tobacco. He reports that he uses drugs, including Marijuana. He reports that he does not drink alcohol.  Ambulation: ambulates without assistance.  No Known Allergies  Family History  Problem Relation Age of Onset  . Hypertension Mother   . Gout Father   . Hypertension Father     Prior to Admission medications   Medication Sig Start Date End Date Taking? Authorizing Provider  ibuprofen (ADVIL,MOTRIN) 200 MG tablet Take 400-600 mg by mouth every 6 (six) hours as needed for fever or moderate pain.   Yes [provider]  acyclovir (ZOVIRAX) 400 MG tablet Take 2 tablets (800 mg total) by mouth 2 (two) times daily. Patient not taking: Reported on 08/10/2016 02/07/15   Comer Locket, PA-C    Physical Exam: Vitals:   08/10/16 0945 08/10/16 1001 08/10/16 1130 08/10/16 1135  BP: 139/80  (!) 163/81 (!) 163/81  Pulse: (!) 104  (!) 105 (!) 105  Resp: (!) 29  (!) 21   Temp:  (!) 103.3 F (39.6 C)  (!) 101.6 F (38.7 C)  TempSrc:  Rectal  Oral  SpO2: 99%  97% 95%  Weight:    124.7 kg (275 lb)  Height:    _0  (1.676 m)    Constitutional: NAD, calm, appears ill Vitals:   08/10/16 0945 08/10/16 1001 08/10/16 1130 08/10/16 1135  BP:  139/80  (!) 163/81 (!) 163/81  Pulse: (!) 104  (!) 105 (!) 105  Resp: (!) 29  (!) 21   Temp:  (!) 103.3 F (39.6 C)  (!) 101.6 F (38.7 C)  TempSrc:  Rectal  Oral  SpO2: 99%  97% 95%  Weight:    124.7 kg (275 lb)  Height:    _1  (1.676 m)   Eyes: PERRL, lids and conjunctivae normal ENMT: Mucous membranes are moist. Posterior pharynx clear of any exudate or lesions.Normal dentition.  Neck: normal, supple, no masses, no thyromegaly Respiratory: clear to auscultation bilaterally, no wheezing, no crackles. Normal respiratory effort. No accessory muscle use.  Cardiovascular: Regular rate and rhythm, no murmurs / rubs / gallops. No extremity edema. 2+ pedal pulses. No carotid bruits.  Abdomen: tender in mid abdomen, (+) BS Musculoskeletal: no clubbing / cyanosis. No joint deformity upper and lower extremities. Good ROM, no contractures. Normal muscle tone.  Skin: no rashes, lesions, ulcers. No induration Neurologic: CN 2-12 grossly intact. Sensation intact, DTR normal. Strength 5/5 in all 4.  Psychiatric: Normal judgment and insight. Alert and oriented x 3. Normal mood.   Labs on Admission: I have personally reviewed following labs and imaging studies  CBC:  Recent Labs Lab 08/10/16 0957  WBC 19.1*  HGB 13.3  HCT 38.1*  MCV 88.2  PLT 161   Basic Metabolic Panel:  Recent Labs Lab 08/10/16 0957  NA 132*  K 3.2*  CL 101  CO2 19*  GLUCOSE 133*  BUN 9  CREATININE 1.20  CALCIUM 8.9   GFR: Estimated Creatinine Clearance: 110.3 mL/min (by C-G formula based on SCr of 1.2 mg/dL). Liver Function Tests:  Recent Labs Lab 08/10/16 0957  AST 39  ALT 47  ALKPHOS 95  BILITOT 3.1*  PROT 7.3  ALBUMIN 3.8    Recent Labs Lab 08/10/16 0957  LIPASE 20   No results for input(s): AMMONIA in the last 168 hours. Coagulation Profile: No results for input(s): INR, PROTIME in the last 168 hours. Cardiac Enzymes: No results for input(s): CKTOTAL, CKMB, CKMBINDEX, TROPONINI in  the last 168 hours. BNP (last 3 results) No results for input(s): PROBNP in the last 8760 hours. HbA1C: No results for input(s): HGBA1C in the last 72 hours. CBG: No results for input(s): GLUCAP in the last 168 hours. Lipid Profile: No results for input(s): CHOL, HDL, LDLCALC, TRIG, CHOLHDL, LDLDIRECT in the last 72 hours. Thyroid Function Tests: No results for input(s): TSH, T4TOTAL, FREET4, T3FREE, THYROIDAB in the last 72 hours. Anemia Panel: No results for input(s): VITAMINB12, FOLATE, FERRITIN, TIBC, IRON, RETICCTPCT in the last 72 hours. Urine analysis:    Component Value Date/Time   COLORURINE AMBER (A) 08/10/2016 0849   APPEARANCEUR CLEAR 08/10/2016 0849   LABSPEC 1.027 08/10/2016 0849   PHURINE 5.0 08/10/2016 0849   GLUCOSEU NEGATIVE 08/10/2016 0849   HGBUR NEGATIVE 08/10/2016 0849   BILIRUBINUR NEGATIVE 08/10/2016 [place] 08/10/2016 0849   PROTEINUR 100 (A) 08/10/2016 0849   NITRITE NEGATIVE 08/10/2016 0849   LEUKOCYTESUR NEGATIVE 08/10/2016 0849   Sepsis Labs: _2 (procalcitonin:4,lacticidven:4) )No results found for this or any previous visit (from the past 240 hour(s)).   Radiological Exams on Admission: Ct Chest Wo  Contrast  Result Date: 08/10/2016 CLINICAL DATA:  Cough, shortness of breath. EXAM: CT CHEST WITHOUT CONTRAST TECHNIQUE: Multidetector CT imaging of the chest was performed following the standard protocol without IV contrast. COMPARISON:  None. FINDINGS: Cardiovascular: No significant vascular findings. Normal heart size. No pericardial effusion. Mediastinum/Nodes: No enlarged mediastinal or axillary lymph nodes. Thyroid gland, trachea, and esophagus demonstrate no significant findings. Lungs/Pleura: No pneumothorax or pleural effusion is noted. Large right upper lobe pneumonia is noted. Left lung is clear. Several small focal opacities are noted in the right middle lobe most consistent with pneumonia as well. Upper Abdomen: No acute  abnormality. Musculoskeletal: No chest wall mass or suspicious bone lesions identified. IMPRESSION: Large right upper lobe pneumonia. Small foci of inflammation are noted in the right middle lobe is well. Electronically Signed   By: Marijo Conception, M.D.   On: 08/10/2016 12:09   Ct Abdomen Pelvis W Contrast  Result Date: 08/10/2016 CLINICAL DATA:  Nausea, vomiting, decreased appetite and lower abdominal pain. EXAM: CT ABDOMEN AND PELVIS WITH CONTRAST TECHNIQUE: Multidetector CT imaging of the abdomen and pelvis was performed using the standard protocol following bolus administration of intravenous contrast. CONTRAST:  100 cc Isovue-300 COMPARISON:  None. FINDINGS: Lower chest: Patchy airspace opacity in the right middle lobe could be a developing right middle lobe pneumonia. There are small pulmonary nodules noted bilaterally. These are indeterminate. Would recommend a dedicated chest CT to evaluate rest of the lungs. No pleural effusion.  The distal esophagus is grossly normal. Hepatobiliary: No focal hepatic lesions or intrahepatic biliary dilatation. The gallbladder is normal. No common bile duct dilatation. Pancreas: No mass, inflammation or ductal dilatation. Spleen: Normal size.  No focal lesions. Adrenals/Urinary Tract: The adrenal glands and kidneys are normal. No ureteral or bladder calculi. Stomach/Bowel: The stomach, duodenum, small bowel and colon are unremarkable. No inflammatory changes, mass lesions or obstructive findings. Fibrofatty type changes involving the right colon could be related to prior inflammatory bowel disease. No active inflammation is identified. The terminal ileum is normal. The appendix is normal. Vascular/Lymphatic: The aorta and branch vessels are normal. The major venous structures are normal. Small scattered mesenteric and retroperitoneal lymph nodes but no mass or adenopathy. Reproductive: The prostate gland and seminal vesicles are normal. Other: No pelvic mass or  adenopathy. No free pelvic fluid collections. No inguinal mass or adenopathy. No abdominal wall hernia or subcutaneous lesions. Musculoskeletal: No significant bony findings. IMPRESSION: 1. Patchy right middle lobe airspace process could reflect a developing right middle lobe pneumonia. 2. Small bilateral pulmonary nodules. Recommend dedicated chest CT for further evaluation. 3. No acute abdominal/pelvic findings, mass lesions or lymphadenopathy. 4. Fibrofatty infiltrated type changes involving the right colon could be due to remote inflammatory bowel disease. Electronically Signed   By: Marijo Sanes M.D.   On: 08/10/2016 11:22    EKG: pending   Assessment/Plan  Principal Problem:   Sepsis due to pneumonia ([place]) / Leukocytosis - Sepsis criteria met on admission with fever, tachycardia, tachypnea, leukocytosis  - CT scan showed large right lobe pneumonia - CT also showed infiltrative changes in the colon possibly from remote inflammatory bowel disease - Sepsis and pneumonia order set utilized - Started vanco and zosyn - Obtain blood, resp cx - Obtain HI, legionella and strep pneumonia - Obtain GI and resp virus panel - Check UDS and ethanol level    Active Problems:   Hypokalemia - Due to sepsis - Supplemented - Follow up BMP in am   DVT  prophylaxis: SCD's Code Status: full code Family Communication: no family at the bedside Disposition Plan: admission to telemetry  Consults called: None   Disposition plan: Further plan will depend as patient's clinical course evolves and further radiologic and laboratory data become available.    At the time of admission, it appears that the appropriate admission status for this patient is INPATIENT .Thisis judged to be reasonable and necessary in order to provide the required intensity of service to ensure the patient's safetygiven the patient presentation of sepsis, large pneumonia and possibly viral infection in addition to physical exam  findings, radiographic and laboratory data in the context of chronic comorbidities.   Leisa Lenz MD [facility] Pager [phone]  If 7PM-7AM, please contact night-coverage [email or web address] Password [email or web address]  08/10/2016, 1:18 PM

## 2016-08-10 NOTE — ED Triage Notes (Addendum)
Patient c/o abdominal pain and thigh pain. Patient also c/o N/V/D x 4 days. Patient also reports that he had a syncopal episode yestserday.

## 2016-08-10 NOTE — ED Provider Notes (Signed)
[facility] DEPT Provider Note   CSN: [id number] Arrival date & time: 08/10/16  8299     History   Chief Complaint Chief Complaint  Patient presents with  . Abdominal Pain  . Emesis  . Diarrhea    HPI Louis Hoffman is a 31 y.o. male.  Patient presents with a complaint of abdominal pain is associated with nausea vomiting and diarrhea for 4 days. Patient states he had a syncopal episode yesterday. Patient's main complaint has been a fever diaphoresis vomited 3 times yesterday and diarrhea 4-6 times just today. She has saturations 100%. Past medical history noncontributory.      History reviewed. No pertinent past medical history.  There are no active problems to display for this patient.   History reviewed. No pertinent surgical history.     Home Medications    Prior to Admission medications   Medication Sig Start Date End Date Taking? Authorizing Provider  ibuprofen (ADVIL,MOTRIN) 200 MG tablet Take 400-600 mg by mouth every 6 (six) hours as needed for fever or moderate pain.   Yes [provider]  acyclovir (ZOVIRAX) 400 MG tablet Take 2 tablets (800 mg total) by mouth 2 (two) times daily. Patient not taking: Reported on 08/10/2016 02/07/15   Comer Locket, PA-C    Family History Family History  Problem Relation Age of Onset  . Hypertension Mother   . Gout Father   . Hypertension Father     Social History Social History  Substance Use Topics  . Smoking status: Current Every Day Smoker    Packs/day: 1.00    Types: Cigarettes  . Smokeless tobacco: Never Used  . Alcohol use No     Allergies   Patient has no known allergies.   Review of Systems Review of Systems  Constitutional: Positive for diaphoresis and fever.  HENT: Negative for congestion.   Eyes: Negative for visual disturbance.  Respiratory: Negative for shortness of breath.   Cardiovascular: Negative for chest pain.  Gastrointestinal: Positive for abdominal pain, diarrhea,  nausea and vomiting.  Genitourinary: Negative for dysuria.  Musculoskeletal: Negative for back pain.  Skin: Negative for rash.  Neurological: Negative for headaches.  Hematological: Does not bruise/bleed easily.  Psychiatric/Behavioral: Negative for confusion.     Physical Exam Updated Vital Signs BP (!) 163/81 (BP Location: Right Arm)   Pulse (!) 105   Temp (!) 101.6 F (38.7 C) (Oral)   Resp (!) 21   Ht 1.676 m (5' 6" )   Wt 124.7 kg (275 lb)   SpO2 95%   BMI 44.39 kg/m   Physical Exam  Constitutional: He is oriented to person, place, and time. He appears well-developed and well-nourished. No distress.  HENT:  Head: Normocephalic and atraumatic.  Mouth/Throat: Oropharynx is clear and moist.  Eyes: EOM are normal. Pupils are equal, round, and reactive to light.  Neck: Normal range of motion. Neck supple.  Cardiovascular: Regular rhythm and normal heart sounds.   Tachycardic  Pulmonary/Chest: Effort normal and breath sounds normal.  Abdominal: Bowel sounds are normal. There is no tenderness.  Musculoskeletal: Normal range of motion.  Neurological: He is alert and oriented to person, place, and time. No cranial nerve deficit. He exhibits normal muscle tone. Coordination normal.  Skin: Skin is warm.  Nursing note and vitals reviewed.    ED Treatments / Results  Labs (all labs ordered are listed, but only abnormal results are displayed) Labs Reviewed  COMPREHENSIVE METABOLIC PANEL - Abnormal; Notable for the following:  Result Value   Sodium 132 (*)    Potassium 3.2 (*)    CO2 19 (*)    Glucose, Bld 133 (*)    Total Bilirubin 3.1 (*)    All other components within normal limits  CBC - Abnormal; Notable for the following:    WBC 19.1 (*)    HCT 38.1 (*)    All other components within normal limits  URINALYSIS, ROUTINE W REFLEX MICROSCOPIC - Abnormal; Notable for the following:    Color, Urine AMBER (*)    Protein, ur 100 (*)    Squamous Epithelial / LPF 0-5  (*)    All other components within normal limits  CULTURE, BLOOD (ROUTINE X 2)  CULTURE, BLOOD (ROUTINE X 2)  CULTURE, BLOOD (ROUTINE X 2)  CULTURE, BLOOD (ROUTINE X 2)  CULTURE, EXPECTORATED SPUTUM-ASSESSMENT  GRAM STAIN  LIPASE, BLOOD  RAPID URINE DRUG SCREEN, HOSP PERFORMED  ETHANOL  LACTIC ACID, PLASMA  LACTIC ACID, PLASMA  PROCALCITONIN  HIV ANTIBODY (ROUTINE TESTING)  STREP PNEUMONIAE URINARY ANTIGEN  INFLUENZA PANEL BY PCR (TYPE A & B)  LEGIONELLA PNEUMOPHILA SEROGP 1 UR AG  I-STAT CG4 LACTIC ACID, ED   Results for orders placed or performed during the hospital encounter of 08/10/16  Lipase, blood  Result Value Ref Range   Lipase 20 11 - 51 U/L  Comprehensive metabolic panel  Result Value Ref Range   Sodium 132 (L) 135 - 145 mmol/L   Potassium 3.2 (L) 3.5 - 5.1 mmol/L   Chloride 101 101 - 111 mmol/L   CO2 19 (L) 22 - 32 mmol/L   Glucose, Bld 133 (H) 65 - 99 mg/dL   BUN 9 6 - 20 mg/dL   Creatinine, Ser 1.20 0.61 - 1.24 mg/dL   Calcium 8.9 8.9 - 10.3 mg/dL   Total Protein 7.3 6.5 - 8.1 g/dL   Albumin 3.8 3.5 - 5.0 g/dL   AST 39 15 - 41 U/L   ALT 47 17 - 63 U/L   Alkaline Phosphatase 95 38 - 126 U/L   Total Bilirubin 3.1 (H) 0.3 - 1.2 mg/dL   GFR calc non Af Amer >60 >60 mL/min   GFR calc Af Amer >60 >60 mL/min   Anion gap 12 5 - 15  CBC  Result Value Ref Range   WBC 19.1 (H) 4.0 - 10.5 K/uL   RBC 4.32 4.22 - 5.81 MIL/uL   Hemoglobin 13.3 13.0 - 17.0 g/dL   HCT 38.1 (L) 39.0 - 52.0 %   MCV 88.2 78.0 - 100.0 fL   MCH 30.8 26.0 - 34.0 pg   MCHC 34.9 30.0 - 36.0 g/dL   RDW 12.2 11.5 - 15.5 %   Platelets 172 150 - 400 K/uL  Urinalysis, Routine w reflex microscopic  Result Value Ref Range   Color, Urine AMBER (A) YELLOW   APPearance CLEAR CLEAR   Specific Gravity, Urine 1.027 1.005 - 1.030   pH 5.0 5.0 - 8.0   Glucose, UA NEGATIVE NEGATIVE mg/dL   Hgb urine dipstick NEGATIVE NEGATIVE   Bilirubin Urine NEGATIVE NEGATIVE   Ketones, ur NEGATIVE NEGATIVE  mg/dL   Protein, ur 100 (A) NEGATIVE mg/dL   Nitrite NEGATIVE NEGATIVE   Leukocytes, UA NEGATIVE NEGATIVE   RBC / HPF 0-5 0 - 5 RBC/hpf   WBC, UA 0-5 0 - 5 WBC/hpf   Bacteria, UA NONE SEEN NONE SEEN   Squamous Epithelial / LPF 0-5 (A) NONE SEEN   Mucous PRESENT   I-Stat  CG4 Lactic Acid, ED  (not at  [facility])  Result Value Ref Range   Lactic Acid, Venous 0.83 0.5 - 1.9 mmol/L     EKG  EKG Interpretation  Date/Time:  Monday August 10 2016 09:43:43 EDT Ventricular Rate:  101 PR Interval:    QRS Duration: 88 QT Interval:  308 QTC Calculation: 400 R Axis:   93 Text Interpretation:  Sinus tachycardia Borderline right axis deviation No previous ECGs available Confirmed by Fredia Sorrow [phone]) on 08/10/2016 11:02:52 AM       Radiology Ct Chest Wo Contrast  Result Date: 08/10/2016 CLINICAL DATA:  Cough, shortness of breath. EXAM: CT CHEST WITHOUT CONTRAST TECHNIQUE: Multidetector CT imaging of the chest was performed following the standard protocol without IV contrast. COMPARISON:  None. FINDINGS: Cardiovascular: No significant vascular findings. Normal heart size. No pericardial effusion. Mediastinum/Nodes: No enlarged mediastinal or axillary lymph nodes. Thyroid gland, trachea, and esophagus demonstrate no significant findings. Lungs/Pleura: No pneumothorax or pleural effusion is noted. Large right upper lobe pneumonia is noted. Left lung is clear. Several small focal opacities are noted in the right middle lobe most consistent with pneumonia as well. Upper Abdomen: No acute abnormality. Musculoskeletal: No chest wall mass or suspicious bone lesions identified. IMPRESSION: Large right upper lobe pneumonia. Small foci of inflammation are noted in the right middle lobe is well. Electronically Signed   By: Marijo Conception, M.D.   On: 08/10/2016 12:09   Ct Abdomen Pelvis W Contrast  Result Date: 08/10/2016 CLINICAL DATA:  Nausea, vomiting, decreased appetite and lower abdominal pain. EXAM: CT  ABDOMEN AND PELVIS WITH CONTRAST TECHNIQUE: Multidetector CT imaging of the abdomen and pelvis was performed using the standard protocol following bolus administration of intravenous contrast. CONTRAST:  100 cc Isovue-300 COMPARISON:  None. FINDINGS: Lower chest: Patchy airspace opacity in the right middle lobe could be a developing right middle lobe pneumonia. There are small pulmonary nodules noted bilaterally. These are indeterminate. Would recommend a dedicated chest CT to evaluate rest of the lungs. No pleural effusion.  The distal esophagus is grossly normal. Hepatobiliary: No focal hepatic lesions or intrahepatic biliary dilatation. The gallbladder is normal. No common bile duct dilatation. Pancreas: No mass, inflammation or ductal dilatation. Spleen: Normal size.  No focal lesions. Adrenals/Urinary Tract: The adrenal glands and kidneys are normal. No ureteral or bladder calculi. Stomach/Bowel: The stomach, duodenum, small bowel and colon are unremarkable. No inflammatory changes, mass lesions or obstructive findings. Fibrofatty type changes involving the right colon could be related to prior inflammatory bowel disease. No active inflammation is identified. The terminal ileum is normal. The appendix is normal. Vascular/Lymphatic: The aorta and branch vessels are normal. The major venous structures are normal. Small scattered mesenteric and retroperitoneal lymph nodes but no mass or adenopathy. Reproductive: The prostate gland and seminal vesicles are normal. Other: No pelvic mass or adenopathy. No free pelvic fluid collections. No inguinal mass or adenopathy. No abdominal wall hernia or subcutaneous lesions. Musculoskeletal: No significant bony findings. IMPRESSION: 1. Patchy right middle lobe airspace process could reflect a developing right middle lobe pneumonia. 2. Small bilateral pulmonary nodules. Recommend dedicated chest CT for further evaluation. 3. No acute abdominal/pelvic findings, mass lesions or  lymphadenopathy. 4. Fibrofatty infiltrated type changes involving the right colon could be due to remote inflammatory bowel disease. Electronically Signed   By: Marijo Sanes M.D.   On: 08/10/2016 11:22    Procedures Procedures (including critical care time)  CRITICAL CARE Performed by: Fredia Sorrow Total critical care  time: 30 minutes Critical care time was exclusive of separately billable procedures and treating other patients. Critical care was necessary to treat or prevent imminent or life-threatening deterioration. Critical care was time spent personally by me on the following activities: development of treatment plan with patient and/or surrogate as well as nursing, discussions with consultants, evaluation of patient's response to treatment, examination of patient, obtaining history from patient or surrogate, ordering and performing treatments and interventions, ordering and review of laboratory studies, ordering and review of radiographic studies, pulse oximetry and re-evaluation of patient's condition.   Medications Ordered in ED Medications  0.9 %  sodium chloride infusion ( Intravenous New Bag/Given 08/10/16 1010)  sodium chloride 0.9 % bolus 1,000 mL (1,000 mLs Intravenous New Bag/Given 08/10/16 1132)    And  sodium chloride 0.9 % bolus 1,000 mL (0 mLs Intravenous Stopped 08/10/16 1304)    And  sodium chloride 0.9 % bolus 1,000 mL (not administered)    And  sodium chloride 0.9 % bolus 1,000 mL (not administered)  iopamidol (ISOVUE-300) 61 % injection (not administered)  vancomycin (VANCOCIN) 2,000 mg in sodium chloride 0.9 % 500 mL IVPB (not administered)  piperacillin-tazobactam (ZOSYN) IVPB 3.375 g (not administered)  vancomycin (VANCOCIN) IVPB 1000 mg/200 mL premix (not administered)  HYDROmorphone (DILAUDID) injection 1 mg (not administered)  sodium chloride 0.9 % bolus 1,000 mL (0 mLs Intravenous Stopped 08/10/16 1135)  ondansetron (ZOFRAN) injection 4 mg (4 mg  Intravenous Given 08/10/16 1010)  HYDROmorphone (DILAUDID) injection 1 mg (1 mg Intravenous Given 08/10/16 1010)  iopamidol (ISOVUE-300) 61 % injection 100 mL (100 mLs Intravenous Contrast Given 08/10/16 1045)  piperacillin-tazobactam (ZOSYN) IVPB 3.375 g (0 g Intravenous Stopped 08/10/16 1304)  acetaminophen (TYLENOL) tablet 1,000 mg (1,000 mg Oral Given 08/10/16 1144)  ondansetron (ZOFRAN) injection 4 mg (4 mg Intravenous Given 08/10/16 1304)     Initial Impression / Assessment and Plan / ED Course  I have reviewed the triage vital signs and the nursing notes.  Pertinent labs & imaging results that were available during my care of the patient were reviewed by me and considered in my medical decision making (see chart for details).    The patient upon presentation met septic criteria. Main complaints were abdominal pain with nausea vomiting and diarrhea. No real respiratory symptoms. Patient was started on the sepsis protocol.   patient started on broad-spectrum antibiotics and given fluid challenge. Patient with some improvement with that. CT scan of the abdomen didn't really have any specific findings. But raise some concerns about a pneumonia. So CT chest was done which shows a large right upper lobe pneumonia with some involvement in the right middle lobe. This most likely explains patient's symptoms. Discussed with hospitalist they will admit. Patient not hypotensive. Still tachycardic heart rate has improved.  Sepsis criteria was met by the fever tachycardia and markedly elevated white blood cell count. No hypoxia  Final Clinical Impressions(s) / ED Diagnoses   Final diagnoses:  Community acquired pneumonia of right upper lobe of lung ([place])  Sepsis, due to unspecified organism [facility])    New Prescriptions New Prescriptions   No medications on file     Fredia Sorrow, MD 08/10/16 1319

## 2016-08-11 ENCOUNTER — Inpatient Hospital Stay (HOSPITAL_COMMUNITY): Payer: Self-pay

## 2016-08-11 DIAGNOSIS — R112 Nausea with vomiting, unspecified: Secondary | ICD-10-CM

## 2016-08-11 DIAGNOSIS — F1210 Cannabis abuse, uncomplicated: Secondary | ICD-10-CM

## 2016-08-11 DIAGNOSIS — E871 Hypo-osmolality and hyponatremia: Secondary | ICD-10-CM

## 2016-08-11 DIAGNOSIS — Z720 Tobacco use: Secondary | ICD-10-CM

## 2016-08-11 DIAGNOSIS — J181 Lobar pneumonia, unspecified organism: Secondary | ICD-10-CM

## 2016-08-11 DIAGNOSIS — R1110 Vomiting, unspecified: Secondary | ICD-10-CM

## 2016-08-11 LAB — BASIC METABOLIC PANEL
ANION GAP: 8 (ref 5–15)
BUN: 7 mg/dL (ref 6–20)
CHLORIDE: 104 mmol/L (ref 101–111)
CO2: 21 mmol/L — ABNORMAL LOW (ref 22–32)
Calcium: 8.1 mg/dL — ABNORMAL LOW (ref 8.9–10.3)
Creatinine, Ser: 1.09 mg/dL (ref 0.61–1.24)
GFR calc Af Amer: >60 mL/min (ref >60)
GFR calc non Af Amer: >60 mL/min (ref >60)
GLUCOSE: 122 mg/dL — AB (ref 65–99)
POTASSIUM: 3.4 mmol/L — AB (ref 3.5–5.1)
SODIUM: 133 mmol/L — AB (ref 135–145)

## 2016-08-11 LAB — GLUCOSE, CAPILLARY: Glucose-Capillary: 170 mg/dL — ABNORMAL HIGH (ref 65–99)

## 2016-08-11 LAB — RESPIRATORY PANEL BY PCR
Adenovirus: NOT DETECTED
BORDETELLA PERTUSSIS-RVPCR: NOT DETECTED
CHLAMYDOPHILA PNEUMONIAE-RVPPCR: NOT DETECTED
CORONAVIRUS 229E-RVPPCR: NOT DETECTED
CORONAVIRUS HKU1-RVPPCR: NOT DETECTED
Coronavirus NL63: NOT DETECTED
Coronavirus OC43: NOT DETECTED
Influenza A: NOT DETECTED
Influenza B: NOT DETECTED
MYCOPLASMA PNEUMONIAE-RVPPCR: NOT DETECTED
Metapneumovirus: NOT DETECTED
Parainfluenza Virus 1: NOT DETECTED
Parainfluenza Virus 2: NOT DETECTED
Parainfluenza Virus 3: NOT DETECTED
Parainfluenza Virus 4: NOT DETECTED
RHINOVIRUS / ENTEROVIRUS - RVPPCR: NOT DETECTED
Respiratory Syncytial Virus: NOT DETECTED

## 2016-08-11 LAB — BILIRUBIN, FRACTIONATED(TOT/DIR/INDIR)
BILIRUBIN INDIRECT: 3.1 mg/dL — AB (ref 0.3–0.9)
BILIRUBIN TOTAL: 5.4 mg/dL — AB (ref 0.3–1.2)
Bilirubin, Direct: 2.3 mg/dL — ABNORMAL HIGH (ref 0.1–0.5)

## 2016-08-11 LAB — GASTROINTESTINAL PANEL BY PCR, STOOL (REPLACES STOOL CULTURE)
ASTROVIRUS: NOT DETECTED
Adenovirus F40/41: NOT DETECTED
CAMPYLOBACTER SPECIES: NOT DETECTED
Cryptosporidium: NOT DETECTED
Cyclospora cayetanensis: NOT DETECTED
ENTEROTOXIGENIC E COLI (ETEC): NOT DETECTED
Entamoeba histolytica: NOT DETECTED
Enteroaggregative E coli (EAEC): NOT DETECTED
Enteropathogenic E coli (EPEC): NOT DETECTED
Giardia lamblia: NOT DETECTED
Norovirus GI/GII: NOT DETECTED
PLESIMONAS SHIGELLOIDES: NOT DETECTED
Rotavirus A: NOT DETECTED
SALMONELLA SPECIES: NOT DETECTED
SAPOVIRUS (I, II, IV, AND V): NOT DETECTED
SHIGA LIKE TOXIN PRODUCING E COLI (STEC): NOT DETECTED
SHIGELLA/ENTEROINVASIVE E COLI (EIEC): NOT DETECTED
VIBRIO SPECIES: NOT DETECTED
Vibrio cholerae: NOT DETECTED
Yersinia enterocolitica: NOT DETECTED

## 2016-08-11 LAB — CBC
HEMATOCRIT: 33.9 % — AB (ref 39.0–52.0)
HEMOGLOBIN: 11.5 g/dL — AB (ref 13.0–17.0)
MCH: 30.3 pg (ref 26.0–34.0)
MCHC: 33.9 g/dL (ref 30.0–36.0)
MCV: 89.4 fL (ref 78.0–100.0)
Platelets: 168 10*3/uL (ref 150–400)
RBC: 3.79 MIL/uL — ABNORMAL LOW (ref 4.22–5.81)
RDW: 12.4 % (ref 11.5–15.5)
WBC: 15.8 10*3/uL — AB (ref 4.0–10.5)

## 2016-08-11 LAB — C DIFFICILE QUICK SCREEN W PCR REFLEX
C DIFFICILE (CDIFF) TOXIN: NEGATIVE
C Diff antigen: NEGATIVE
C Diff interpretation: NOT DETECTED

## 2016-08-11 LAB — LEGIONELLA PNEUMOPHILA SEROGP 1 UR AG: L. pneumophila Serogp 1 Ur Ag: POSITIVE — AB

## 2016-08-11 LAB — HIV ANTIBODY (ROUTINE TESTING W REFLEX): HIV Screen 4th Generation wRfx: NONREACTIVE

## 2016-08-11 LAB — LACTIC ACID, PLASMA: LACTIC ACID, VENOUS: 0.8 mmol/L (ref 0.5–1.9)

## 2016-08-11 LAB — MRSA PCR SCREENING: MRSA by PCR: NEGATIVE

## 2016-08-11 MED ORDER — AZITHROMYCIN 250 MG PO TABS: 500.0000 mg | ORAL_TABLET | Freq: Every day | ORAL | Status: DC

## 2016-08-11 MED ORDER — POTASSIUM CHLORIDE IN NACL 20-0.9 MEQ/L-% IV SOLN
INTRAVENOUS | Status: DC
  Administered 2016-08-11 – 2016-08-12 (×3): via INTRAVENOUS
  Filled 2016-08-11 (×4): qty 1000

## 2016-08-11 MED ORDER — ENOXAPARIN SODIUM 40 MG/0.4ML ~~LOC~~ SOLN
40.0000 mg | SUBCUTANEOUS | Status: DC
  Administered 2016-08-11 – 2016-08-12 (×2): 40 mg via SUBCUTANEOUS
  Filled 2016-08-11 (×2): qty 0.4

## 2016-08-11 MED ORDER — DEXTROSE 5 % IV SOLN
1000.0000 mg | Freq: Once | INTRAVENOUS | Status: DC
  Filled 2016-08-11: qty 1000

## 2016-08-11 MED ORDER — LEVOFLOXACIN IN D5W 750 MG/150ML IV SOLN
750.0000 mg | INTRAVENOUS | Status: DC
  Administered 2016-08-11 – 2016-08-12 (×2): 750 mg via INTRAVENOUS
  Filled 2016-08-11 (×2): qty 150

## 2016-08-11 MED ORDER — ACETAMINOPHEN 650 MG RE SUPP: 650.0000 mg | RECTAL | Status: DC | PRN

## 2016-08-11 MED ORDER — GADOBENATE DIMEGLUMINE 529 MG/ML IV SOLN: 20.0000 mL | Freq: Once | INTRAVENOUS | Status: DC | PRN

## 2016-08-11 MED ORDER — NICOTINE 21 MG/24HR TD PT24
21.0000 mg | MEDICATED_PATCH | Freq: Every day | TRANSDERMAL | Status: DC
  Administered 2016-08-11 – 2016-08-12 (×2): 21 mg via TRANSDERMAL
  Filled 2016-08-11 (×2): qty 1

## 2016-08-11 MED ORDER — VANCOMYCIN HCL IN DEXTROSE 1-5 GM/200ML-% IV SOLN
1000.0000 mg | Freq: Three times a day (TID) | INTRAVENOUS | Status: DC
  Administered 2016-08-11: 1000 mg via INTRAVENOUS
  Filled 2016-08-11: qty 200

## 2016-08-11 MED ORDER — ACETAMINOPHEN 325 MG PO TABS
650.0000 mg | ORAL_TABLET | ORAL | Status: DC | PRN
  Administered 2016-08-11 – 2016-08-12 (×2): 650 mg via ORAL
  Filled 2016-08-11 (×2): qty 2

## 2016-08-11 NOTE — Progress Notes (Signed)
Pharmacy Antibiotic Note  Louis FantasiaJohnnie Wehrli is a 31 y.o. male admitted on 08/10/2016 with sepsis.  Pharmacy has been consulted for vanc/Zosyn dosing. Sepsis from [facility] PNA. Creat 1.2>1.09, Normalized creat cl 99 ml/min.  WBC 19.1>15.8, lactate WNL. T max 102;   Plan: Increase vancomycin to 1 gm IV q8h Continue zosyn 3.375 gm IV q8h, 4 hr infusion F/u renal fxn, wbc, temp, culture data vanc levels as needed  Height: 5\' 6"  (167.6 cm) Weight: 247 lb 12.8 oz (112.4 kg) IBW/kg (Calculated) : 63.8  Temp (24hrs), Avg:100.2 F (37.9 C), Min:97.9 F (36.6 C), Max:102 F (38.9 C)   Recent Labs Lab 08/10/16 0957 08/10/16 1130 08/10/16 2211 08/11/16 0115  WBC 19.1*  --   --  15.8*  CREATININE 1.20  --   --  1.09  LATICACIDVEN  --  0.83 0.9 0.8    Estimated Creatinine Clearance: 114.5 mL/min (by C-G formula based on SCr of 1.09 mg/dL).    No Known Allergies Antimicrobials this admission: 6/11 Vanc >> 6/11 Zosyn >>  Dose adjustments this admission: 6/12 vanc 1 gm q12>> 1 gm q8h  Microbiology results: 6/12  c diff neg S pneumo neg Flu neg  6/11 BCx2>>   Thank you for allowing pharmacy to be a part of this patient's care. Herby AbrahamMichelle T. [place] Cawood, Pharm.D. [phone] 08/11/2016 10:31 AM

## 2016-08-11 NOTE — Progress Notes (Signed)
Paged by RN due to fever 103.2 Revisited patient at 1615 -pt still c/o low back pain and upper thigh pain -c/o sob.  Denies CP, vomiting, HA, abd pain.  No diarrhea since last night -HR 114--RR20--162/80--96%RA  CV--RRR Lung--fine basilar crackles, no wheeze Abd--soft/NT+BS  Ur Legionella antigen POS--likely explains most of pt's clinical syndrome (diarrhea, n/v, elevated LFTs) -blood cultures neg <24 hours -stool pathogen panel pending -respiratory viral panel neg -bilirubin 5.4 with approx 60% indirect--likely due to legionella  Start levofloxacin D/c vancomycin -continue zosyn for now with plans to discontinue in next 24 hours if blood cultures remain neg and pt improves with levoflox -waiting on MR of L-spine  DTat

## 2016-08-11 NOTE — Progress Notes (Signed)
PROGRESS NOTE  Louis Hoffman GYI:[id number] DOB: May 17, 1985 DOA: 08/10/2016 PCP: Patient, No Pcp Per  Brief History:  31 year old male with a history of tobacco and cannabis use and no other documented chronic medical problems presenting with 4-5 day history of generalized weakness, nausea, vomiting, diarrhea, and lower abdominal pain. The patient denies any recent sick contacts or travels. He complained of subjective fevers and chills for 2-3 days prior to admission with associated sweats. The patient also complained of some shortness of breath without coughing or hemoptysis. He denied any headache, neck pain, chest pain, hemoptysis, hematochezia, melena, dysuria, hematuria. He states that his lower abdominal pain is bilateral and with wraparound to his back. As such, He also had associated low back pain which she states also radiated down to his posterior thighs. He denied any urinary or bowel incontinence. He denied any saddle anesthesia. In the emergency department, the patient was noted to have temperature 103.3F with heart rate 110-120. WBC was 19.1 with lactic acid 0.9. AST 39, ALT 47, alk phosphatase 95, total bilirubin 3.1, lipase 20. CT of the chest showed right upper lobe consolidation with small focal opacities in the right middle lobe. The patient was started on vancomycin and Zosyn after blood cultures were obtained.  Assessment/Plan: Sepsis -Secondary to pneumonia -Concerned about underlying bacteremia -Continue empiric vancomycin and Zosyn pending culture data -Lactic acid. 0.9 -Procalcitonin 0.25 -Continue IV fluids--increase rate -UA neg for pyuria  Lobar Pneumonia -Patient likely has component of aspiration pneumonia secondary to his recurrent vomiting -Continue Zosyn for now pending clinical progress -MRSA screen -influenza PCR neg -follow up viral respiratory panel -follow up HIV  Intractable vomiting and diarrhea -The patient likely has a component of  cannabis induced hyperemesis syndrome -Lipase 20 -Continue close liquids for now -Continue symptomatically treatment -08/10/2016 -CT abdomen and pelvis--fibrofatty and for further changes in the right colon; no other intra-abdominal acute findings -Cdiff negative -follow up stool pathogen panel  Back pain -MRI lumbar L-spine -continue dilaudid prn pain Tobacco abuse Hyperbilirubinemia -Fractionate bilirubin -Negative gallbladder without ductal dilatation on CT -suspect component of Gilbert's -hep B surface antigen -hep C antibody  Hyponatremia -Secondary to volume depletion -Continue IV fluids  Hypokalemia -Add Potassium to IVF and increase rate -Replete -check mag  Tobacco and cannabis use -Cessation discussed -NicoDerm patch   Disposition Plan:   Home in 2-3 days  Family Communication:  No Family at bedside  Consultants: none   Code Status:  FULL  DVT Prophylaxis:  [place] Lovenox   Procedures: As Listed in Progress Note Above  Antibiotics: vanco 6/11>>> Zosyn 6/11>>>    Subjective: Patient continues to have subjective fevers and chills with diaphoresis. Denies any shortness of breath, abdominal pain. He states that those symptoms are improving. Continues complain of low back pain that is dull in nature exacerbated with movement. It is constant in nature. Denies any dysuria, hematuria, hematochezia, melena. He continues to have some loose stools. He had one episode of emesis this morning without blood.  Objective: Vitals:   08/10/16 1613 08/10/16 2218 08/11/16 0007 08/11/16 0541  BP: (!) 158/92 (!) 149/88  (!) 155/79  Pulse: 84 96  (!) 116  Resp: 20 (!) 22  (!) 22  Temp: 97.9 F (36.6 C) (!) 102 F (38.9 C) 99.8 F (37.7 C) 99.9 F (37.7 C)  TempSrc: Oral Oral Oral Oral  SpO2: 99% 100%  96%  Weight: 124.7 kg (275 lb)   112.4 kg (  247 lb 12.8 oz)  Height: 5' 6"  (1.676 m)       Intake/Output Summary (Last 24 hours) at 08/11/16 1134 Last data filed at  08/11/16 0600  Gross per 24 hour  Intake          4404.17 ml  Output              150 ml  Net          4254.17 ml   Weight change:  Exam:   General:  Pt is alert, follows commands appropriately, not in acute distress  HEENT: No icterus, No thrush, No neck mass, [place]/AT  Cardiovascular: RRR, S1/S2, no rubs, no gallops  Respiratory: Bibasilar crackles, right greater than left. No wheezing  Abdomen: Soft/+BS, non tender, non distended, no guarding  Extremities: No edema, No lymphangitis, No petechiae, No rashes, no synovitis; negative straight leg raise test; no visible rashes on the patient's back or flanks   Data Reviewed: I have personally reviewed following labs and imaging studies Basic Metabolic Panel:  Recent Labs Lab 08/10/16 0957 08/11/16 0115  NA 132* 133*  K 3.2* 3.4*  CL 101 104  CO2 19* 21*  GLUCOSE 133* 122*  BUN 9 7  CREATININE 1.20 1.09  CALCIUM 8.9 8.1*   Liver Function Tests:  Recent Labs Lab 08/10/16 0957  AST 39  ALT 47  ALKPHOS 95  BILITOT 3.1*  PROT 7.3  ALBUMIN 3.8    Recent Labs Lab 08/10/16 0957  LIPASE 20   No results for input(s): AMMONIA in the last 168 hours. Coagulation Profile: No results for input(s): INR, PROTIME in the last 168 hours. CBC:  Recent Labs Lab 08/10/16 0957 08/11/16 0115  WBC 19.1* 15.8*  HGB 13.3 11.5*  HCT 38.1* 33.9*  MCV 88.2 89.4  PLT 172 168   Cardiac Enzymes: No results for input(s): CKTOTAL, CKMB, CKMBINDEX, TROPONINI in the last 168 hours. BNP: Invalid input(s): POCBNP CBG:  Recent Labs Lab 08/11/16 0749  GLUCAP 170*   HbA1C: No results for input(s): HGBA1C in the last 72 hours. Urine analysis:    Component Value Date/Time   COLORURINE AMBER (A) 08/10/2016 0849   APPEARANCEUR CLEAR 08/10/2016 0849   LABSPEC 1.027 08/10/2016 0849   PHURINE 5.0 08/10/2016 0849   GLUCOSEU NEGATIVE 08/10/2016 0849   HGBUR NEGATIVE 08/10/2016 0849   BILIRUBINUR NEGATIVE 08/10/2016 [place] 08/10/2016 0849   PROTEINUR 100 (A) 08/10/2016 0849   NITRITE NEGATIVE 08/10/2016 0849   LEUKOCYTESUR NEGATIVE 08/10/2016 0849   Sepsis Labs: @LABRCNTIP (procalcitonin:4,lacticidven:4) ) Recent Results (from the past 240 hour(s))  Respiratory Panel by PCR     Status: None   Collection Time: 08/10/16  6:02 PM  Result Value Ref Range Status   Adenovirus NOT DETECTED NOT DETECTED Final   Coronavirus 229E NOT DETECTED NOT DETECTED Final   Coronavirus HKU1 NOT DETECTED NOT DETECTED Final   Coronavirus NL63 NOT DETECTED NOT DETECTED Final   Coronavirus OC43 NOT DETECTED NOT DETECTED Final   Metapneumovirus NOT DETECTED NOT DETECTED Final   Rhinovirus / Enterovirus NOT DETECTED NOT DETECTED Final   Influenza A NOT DETECTED NOT DETECTED Final   Influenza B NOT DETECTED NOT DETECTED Final   Parainfluenza Virus 1 NOT DETECTED NOT DETECTED Final   Parainfluenza Virus 2 NOT DETECTED NOT DETECTED Final   Parainfluenza Virus 3 NOT DETECTED NOT DETECTED Final   Parainfluenza Virus 4 NOT DETECTED NOT DETECTED Final   Respiratory Syncytial Virus NOT DETECTED NOT DETECTED  Final   Bordetella pertussis NOT DETECTED NOT DETECTED Final   Chlamydophila pneumoniae NOT DETECTED NOT DETECTED Final   Mycoplasma pneumoniae NOT DETECTED NOT DETECTED Final    Comment: Performed at [facility], [place] [address] [place], [place] [address]  C difficile quick scan w PCR reflex     Status: None   Collection Time: 08/11/16 12:20 AM  Result Value Ref Range Status   C Diff antigen NEGATIVE NEGATIVE Final   C Diff toxin NEGATIVE NEGATIVE Final   C Diff interpretation No C. difficile detected.  Final     Scheduled Meds: . nicotine  21 mg Transdermal Daily   Continuous Infusions: . sodium chloride 50 mL/hr at 08/10/16 2306  . piperacillin-tazobactam (ZOSYN)  IV Stopped (08/11/16 0830)  . vancomycin      Procedures/Studies: Ct Chest Wo Contrast  Result Date: 08/10/2016 CLINICAL DATA:   Cough, shortness of breath. EXAM: CT CHEST WITHOUT CONTRAST TECHNIQUE: Multidetector CT imaging of the chest was performed following the standard protocol without IV contrast. COMPARISON:  None. FINDINGS: Cardiovascular: No significant vascular findings. Normal heart size. No pericardial effusion. Mediastinum/Nodes: No enlarged mediastinal or axillary lymph nodes. Thyroid gland, trachea, and esophagus demonstrate no significant findings. Lungs/Pleura: No pneumothorax or pleural effusion is noted. Large right upper lobe pneumonia is noted. Left lung is clear. Several small focal opacities are noted in the right middle lobe most consistent with pneumonia as well. Upper Abdomen: No acute abnormality. Musculoskeletal: No chest wall mass or suspicious bone lesions identified. IMPRESSION: Large right upper lobe pneumonia. Small foci of inflammation are noted in the right middle lobe is well. Electronically Signed   By: Marijo Conception, M.D.   On: 08/10/2016 12:09   Ct Abdomen Pelvis W Contrast  Result Date: 08/10/2016 CLINICAL DATA:  Nausea, vomiting, decreased appetite and lower abdominal pain. EXAM: CT ABDOMEN AND PELVIS WITH CONTRAST TECHNIQUE: Multidetector CT imaging of the abdomen and pelvis was performed using the standard protocol following bolus administration of intravenous contrast. CONTRAST:  100 cc Isovue-300 COMPARISON:  None. FINDINGS: Lower chest: Patchy airspace opacity in the right middle lobe could be a developing right middle lobe pneumonia. There are small pulmonary nodules noted bilaterally. These are indeterminate. Would recommend a dedicated chest CT to evaluate rest of the lungs. No pleural effusion.  The distal esophagus is grossly normal. Hepatobiliary: No focal hepatic lesions or intrahepatic biliary dilatation. The gallbladder is normal. No common bile duct dilatation. Pancreas: No mass, inflammation or ductal dilatation. Spleen: Normal size.  No focal lesions. Adrenals/Urinary Tract: The  adrenal glands and kidneys are normal. No ureteral or bladder calculi. Stomach/Bowel: The stomach, duodenum, small bowel and colon are unremarkable. No inflammatory changes, mass lesions or obstructive findings. Fibrofatty type changes involving the right colon could be related to prior inflammatory bowel disease. No active inflammation is identified. The terminal ileum is normal. The appendix is normal. Vascular/Lymphatic: The aorta and branch vessels are normal. The major venous structures are normal. Small scattered mesenteric and retroperitoneal lymph nodes but no mass or adenopathy. Reproductive: The prostate gland and seminal vesicles are normal. Other: No pelvic mass or adenopathy. No free pelvic fluid collections. No inguinal mass or adenopathy. No abdominal wall hernia or subcutaneous lesions. Musculoskeletal: No significant bony findings. IMPRESSION: 1. Patchy right middle lobe airspace process could reflect a developing right middle lobe pneumonia. 2. Small bilateral pulmonary nodules. Recommend dedicated chest CT for further evaluation. 3. No acute abdominal/pelvic findings, mass lesions or lymphadenopathy. 4.  Fibrofatty infiltrated type changes involving the right colon could be due to remote inflammatory bowel disease. Electronically Signed   By: Marijo Sanes M.D.   On: 08/10/2016 11:22    Harli Engelken, DO  [facility] Pager [phone]  If 7PM-7AM, please contact night-coverage [email or web address] Password [facility] 08/11/2016, 11:34 AM   LOS: 1 day

## 2016-08-11 NOTE — Progress Notes (Signed)
PT temp is 103.2  Blood pressure 167/84.  Pulse 117.  Resp. 23.  MD made aware. PRN tylenol given.

## 2016-08-11 NOTE — Progress Notes (Signed)
Will continue to follow for discharge needs

## 2016-08-12 DIAGNOSIS — A481 Legionnaires' disease: Secondary | ICD-10-CM

## 2016-08-12 DIAGNOSIS — A419 Sepsis, unspecified organism: Principal | ICD-10-CM

## 2016-08-12 DIAGNOSIS — M545 Low back pain: Secondary | ICD-10-CM

## 2016-08-12 DIAGNOSIS — E876 Hypokalemia: Secondary | ICD-10-CM

## 2016-08-12 DIAGNOSIS — J181 Lobar pneumonia, unspecified organism: Secondary | ICD-10-CM

## 2016-08-12 LAB — COMPREHENSIVE METABOLIC PANEL
ALBUMIN: 3 g/dL — AB (ref 3.5–5.0)
ALT: 37 U/L (ref 17–63)
AST: 29 U/L (ref 15–41)
Alkaline Phosphatase: 86 U/L (ref 38–126)
Anion gap: 11 (ref 5–15)
BUN: 6 mg/dL (ref 6–20)
CHLORIDE: 100 mmol/L — AB (ref 101–111)
CO2: 24 mmol/L (ref 22–32)
CREATININE: 0.96 mg/dL (ref 0.61–1.24)
Calcium: 8.5 mg/dL — ABNORMAL LOW (ref 8.9–10.3)
GFR calc Af Amer: >60 mL/min (ref >60)
GFR calc non Af Amer: >60 mL/min (ref >60)
GLUCOSE: 121 mg/dL — AB (ref 65–99)
Potassium: 3.1 mmol/L — ABNORMAL LOW (ref 3.5–5.1)
Sodium: 135 mmol/L (ref 135–145)
Total Bilirubin: 5.8 mg/dL — ABNORMAL HIGH (ref 0.3–1.2)
Total Protein: 6.7 g/dL (ref 6.5–8.1)

## 2016-08-12 LAB — CBC
HEMATOCRIT: 32.6 % — AB (ref 39.0–52.0)
Hemoglobin: 11.6 g/dL — ABNORMAL LOW (ref 13.0–17.0)
MCH: 30.4 pg (ref 26.0–34.0)
MCHC: 35.6 g/dL (ref 30.0–36.0)
MCV: 85.6 fL (ref 78.0–100.0)
Platelets: 215 10*3/uL (ref 150–400)
RBC: 3.81 MIL/uL — ABNORMAL LOW (ref 4.22–5.81)
RDW: 12.3 % (ref 11.5–15.5)
WBC: 17.8 10*3/uL — ABNORMAL HIGH (ref 4.0–10.5)

## 2016-08-12 LAB — HEPATITIS C ANTIBODY: HCV Ab: <0.1 s/co ratio (ref 0.0–0.9)

## 2016-08-12 LAB — GLUCOSE, CAPILLARY: Glucose-Capillary: 115 mg/dL — ABNORMAL HIGH (ref 65–99)

## 2016-08-12 LAB — MAGNESIUM: Magnesium: 1.7 mg/dL (ref 1.7–2.4)

## 2016-08-12 LAB — HEPATITIS B SURFACE ANTIGEN: HEP B S AG: NEGATIVE

## 2016-08-12 MED ORDER — DEXTROSE 5 % IV SOLN
500.0000 mg | INTRAVENOUS | Status: DC
  Administered 2016-08-12: 500 mg via INTRAVENOUS
  Filled 2016-08-12 (×2): qty 500

## 2016-08-12 MED ORDER — GUAIFENESIN-DM 100-10 MG/5ML PO SYRP
5.0000 mL | ORAL_SOLUTION | ORAL | Status: DC | PRN
  Administered 2016-08-12 (×2): 5 mL via ORAL
  Filled 2016-08-12 (×3): qty 10

## 2016-08-12 MED ORDER — ACETAMINOPHEN 500 MG PO TABS
1000.0000 mg | ORAL_TABLET | Freq: Four times a day (QID) | ORAL | Status: DC | PRN
  Administered 2016-08-12: 1000 mg via ORAL
  Filled 2016-08-12: qty 2

## 2016-08-12 MED ORDER — KETOROLAC TROMETHAMINE 15 MG/ML IJ SOLN
15.0000 mg | Freq: Three times a day (TID) | INTRAMUSCULAR | Status: DC | PRN
  Administered 2016-08-12 (×2): 15 mg via INTRAVENOUS
  Filled 2016-08-12 (×2): qty 1

## 2016-08-12 MED ORDER — ACETAMINOPHEN 650 MG RE SUPP: 650.0000 mg | RECTAL | Status: DC | PRN

## 2016-08-12 MED ORDER — POTASSIUM CHLORIDE CRYS ER 20 MEQ PO TBCR
40.0000 meq | EXTENDED_RELEASE_TABLET | ORAL | Status: AC
  Administered 2016-08-12 (×2): 40 meq via ORAL
  Filled 2016-08-12 (×2): qty 2

## 2016-08-12 NOTE — Progress Notes (Signed)
PROGRESS NOTE [facility]   Louis Hoffman   ZOX:[id number] DOB: 10/14/1985  DOA: 08/10/2016 PCP: Patient, No Pcp Per   Brief Narrative:  Louis Hoffman is a 31 y.o. male with medical history of tobacco and cannabis use and no other chronic medical problems presented with history of generalized weakness and nausea vomiting or diarrhea with lower abdominal pain for 5 days prior to admissions. In the ED he was found to have temperature of 103.3 with elevated heart rate to the 120s and WBC of 19.1. CT of the chest show right upper lobe consolidation with small follicle opacities in the right middle lobe. Patient was admitted for pneumonia which was found to be positive Legionella. Patient started on IV antibiotics.   Of note patient report using and old portable air-conditioner for 2-3 days while his air system was repaired.   Subjective: Patient seen and examined, complaining of mild cough. He was febrile overnight. WBC slightly up today. No other complaints.   Assessment & Plan: Sepsis due to right multilobar pneumonia secondary to Legionella Legionella antigen positive Patient initially started on Zosyn and vancomycin which were discontinued and switched to Levaquin and azithromycin MRSA screen negative Influenza PCR negative Vital respiratory panel negative HIV negative  Elevated Bili  Likely due to infectious process  Normal gallbladder w/o ductal dilation on CT  ? Gilbert's Hep B negative   Hypokalemia  Replete  Check K in AM   Back pain - improving  Awaiting for MRI of L spine  Pain medication adjusted  Monitor   Tobacco and cannabis abuse  Cessation discussed  Patient interested on Nicoderm   DVT prophylaxis: Lovenox  Code Status: FULL  Family Communication: Friend at bedside  Disposition Plan: Home in 24-48 hrs   Consultants:   None   Procedures:   None   Antimicrobials: Anti-infectives    Start     Dose/Rate Route Frequency Ordered Stop   08/12/16 1800  azithromycin (ZITHROMAX) tablet 500 mg  Status:  Discontinued     500 mg Oral Daily 08/11/16 1607 08/11/16 1613   08/12/16 1000  azithromycin (ZITHROMAX) 500 mg in dextrose 5 % 250 mL IVPB     500 mg 250 mL/hr over 60 Minutes Intravenous Every 24 hours 08/12/16 0814 08/15/16 0959   08/11/16 1800  azithromycin (ZITHROMAX) 1,000 mg in dextrose 5 % 250 mL IVPB  Status:  Discontinued     1,000 mg 250 mL/hr over 60 Minutes Intravenous  Once 08/11/16 1607 08/11/16 1613   08/11/16 1700  azithromycin (ZITHROMAX) tablet 500 mg  Status:  Discontinued     500 mg Oral Daily 08/11/16 1604 08/11/16 1607   08/11/16 1700  levofloxacin (LEVAQUIN) IVPB 750 mg     750 mg 100 mL/hr over 90 Minutes Intravenous Every 24 hours 08/11/16 1613     08/11/16 1200  vancomycin (VANCOCIN) IVPB 1000 mg/200 mL premix  Status:  Discontinued     1,000 mg 200 mL/hr over 60 Minutes Intravenous Every 8 hours 08/11/16 1028 08/11/16 1610   08/11/16 0200  vancomycin (VANCOCIN) IVPB 1000 mg/200 mL premix  Status:  Discontinued     1,000 mg 200 mL/hr over 60 Minutes Intravenous Every 12 hours 08/10/16 1201 08/11/16 1028   08/10/16 2000  piperacillin-tazobactam (ZOSYN) IVPB 3.375 g  Status:  Discontinued     3.375 g 12.5 mL/hr over 240 Minutes Intravenous Every 8 hours 08/10/16 1201 08/12/16 0814   08/10/16 1215  vancomycin (VANCOCIN) 2,000 mg in sodium chloride 0.9 %  500 mL IVPB     2,000 mg 250 mL/hr over 120 Minutes Intravenous  Once 08/10/16 1201 08/10/16 1515   08/10/16 1145  piperacillin-tazobactam (ZOSYN) IVPB 3.375 g     3.375 g 100 mL/hr over 30 Minutes Intravenous  Once 08/10/16 1136 08/10/16 1654   08/10/16 1145  vancomycin (VANCOCIN) IVPB 1000 mg/200 mL premix  Status:  Discontinued     1,000 mg 200 mL/hr over 60 Minutes Intravenous  Once 08/10/16 1136 08/10/16 1201        Objective: Vitals:   08/12/16 0249 08/12/16 0430 08/12/16 1252 08/12/16 1459  BP:  (!) 134/91    Pulse:  (!) 112    Resp:   (!) 24    Temp: (!) 100.7 F (38.2 C) (!) 101.9 F (38.8 C) (!) 101.4 F (38.6 C) 99.1 F (37.3 C)  TempSrc: Oral Oral  Oral  SpO2:  96%    Weight:  109.4 kg (241 lb 2.9 oz)    Height:        Intake/Output Summary (Last 24 hours) at 08/12/16 1600 Last data filed at 08/12/16 1400  Gross per 24 hour  Intake             4180 ml  Output             1800 ml  Net             2380 ml   Filed Weights   08/10/16 1613 08/11/16 0541 08/12/16 0430  Weight: 124.7 kg (275 lb) 112.4 kg (247 lb 12.8 oz) 109.4 kg (241 lb 2.9 oz)    Examination:  General exam: Appears calm and comfortable  HEENT: AC/AT, PERRLA, OP moist and clear Respiratory system: Good air entry, rales in the RLL, mild decrease BS in RML  Cardiovascular system: S1 & S2 heard, RRR. No JVD, murmurs, rubs or gallops Gastrointestinal system: Abdomen is nondistended, soft and nontender.  Central nervous system: Alert and oriented. No focal neurological deficits. Extremities: No pedal edema.  Skin: No rashes, lesions or ulcers Psychiatry: Judgement and insight appear normal. Mood & affect appropriate.   Data Reviewed: I have personally reviewed following labs and imaging studies  CBC:  Recent Labs Lab 08/10/16 0957 08/11/16 0115 08/12/16 0622  WBC 19.1* 15.8* 17.8*  HGB 13.3 11.5* 11.6*  HCT 38.1* 33.9* 32.6*  MCV 88.2 89.4 85.6  PLT 172 168 215   Basic Metabolic Panel:  Recent Labs Lab 08/10/16 0957 08/11/16 0115 08/12/16 0622  NA 132* 133* 135  K 3.2* 3.4* 3.1*  CL 101 104 100*  CO2 19* 21* 24  GLUCOSE 133* 122* 121*  BUN 9 7 6   CREATININE 1.20 1.09 0.96  CALCIUM 8.9 8.1* 8.5*  MG  --   --  1.7   GFR: Estimated Creatinine Clearance: 128.1 mL/min (by C-G formula based on SCr of 0.96 mg/dL). Liver Function Tests:  Recent Labs Lab 08/10/16 0957 08/11/16 1213 08/12/16 0622  AST 39  --  29  ALT 47  --  37  ALKPHOS 95  --  86  BILITOT 3.1* 5.4* 5.8*  PROT 7.3  --  6.7  ALBUMIN 3.8  --  3.0*     Recent Labs Lab 08/10/16 0957  LIPASE 20   CBG:  Recent Labs Lab 08/11/16 0749 08/12/16 0749  GLUCAP 170* 115*   Sepsis Labs:  Recent Labs Lab 08/10/16 1130 08/10/16 2211 08/11/16 0115  PROCALCITON  --  0.25  --   LATICACIDVEN 0.83 0.9 0.8  Recent Results (from the past 240 hour(s))  Blood Culture (routine x 2)     Status: None (Preliminary result)   Collection Time: 08/10/16 10:00 AM  Result Value Ref Range Status   Specimen Description BLOOD LEFT ANTECUBITAL  Final   Special Requests   Final    BOTTLES DRAWN AEROBIC AND ANAEROBIC Blood Culture adequate volume   Culture   Final    NO GROWTH 2 DAYS Performed at [facility], [address] [place], [place] [address]    Report Status PENDING  Incomplete  Blood Culture (routine x 2)     Status: None (Preliminary result)   Collection Time: 08/10/16 11:24 AM  Result Value Ref Range Status   Specimen Description BLOOD RIGHT ANTECUBITAL  Final   Special Requests   Final    BOTTLES DRAWN AEROBIC AND ANAEROBIC Blood Culture adequate volume   Culture   Final    NO GROWTH 2 DAYS Performed at [facility], [address] [place], [place] [address]    Report Status PENDING  Incomplete  Culture, blood (x 2)     Status: None (Preliminary result)   Collection Time: 08/10/16  4:36 PM  Result Value Ref Range Status   Specimen Description BLOOD LEFT HAND  Final   Special Requests IN PEDIATRIC BOTTLE Blood Culture adequate volume  Final   Culture   Final    NO GROWTH 2 DAYS Performed at [facility], [address] [place], [place] [address]    Report Status PENDING  Incomplete  Respiratory Panel by PCR     Status: None   Collection Time: 08/10/16  6:02 PM  Result Value Ref Range Status   Adenovirus NOT DETECTED NOT DETECTED Final   Coronavirus 229E NOT DETECTED NOT DETECTED Final   Coronavirus HKU1 NOT DETECTED NOT DETECTED Final   Coronavirus NL63 NOT DETECTED NOT DETECTED Final    Coronavirus OC43 NOT DETECTED NOT DETECTED Final   Metapneumovirus NOT DETECTED NOT DETECTED Final   Rhinovirus / Enterovirus NOT DETECTED NOT DETECTED Final   Influenza A NOT DETECTED NOT DETECTED Final   Influenza B NOT DETECTED NOT DETECTED Final   Parainfluenza Virus 1 NOT DETECTED NOT DETECTED Final   Parainfluenza Virus 2 NOT DETECTED NOT DETECTED Final   Parainfluenza Virus 3 NOT DETECTED NOT DETECTED Final   Parainfluenza Virus 4 NOT DETECTED NOT DETECTED Final   Respiratory Syncytial Virus NOT DETECTED NOT DETECTED Final   Bordetella pertussis NOT DETECTED NOT DETECTED Final   Chlamydophila pneumoniae NOT DETECTED NOT DETECTED Final   Mycoplasma pneumoniae NOT DETECTED NOT DETECTED Final    Comment: Performed at [facility], [address] [place], [place] [address]  C difficile quick scan w PCR reflex     Status: None   Collection Time: 08/11/16 12:20 AM  Result Value Ref Range Status   C Diff antigen NEGATIVE NEGATIVE Final   C Diff toxin NEGATIVE NEGATIVE Final   C Diff interpretation No C. difficile detected.  Final  Gastrointestinal Panel by PCR , Stool     Status: None   Collection Time: 08/11/16 12:20 AM  Result Value Ref Range Status   Campylobacter species NOT DETECTED NOT DETECTED Final   Plesimonas shigelloides NOT DETECTED NOT DETECTED Final   Salmonella species NOT DETECTED NOT DETECTED Final   Yersinia enterocolitica NOT DETECTED NOT DETECTED Final   Vibrio species NOT DETECTED NOT DETECTED Final   Vibrio cholerae NOT DETECTED NOT  DETECTED Final   Enteroaggregative E coli (EAEC) NOT DETECTED NOT DETECTED Final   Enteropathogenic E coli (EPEC) NOT DETECTED NOT DETECTED Final   Enterotoxigenic E coli (ETEC) NOT DETECTED NOT DETECTED Final   Shiga like toxin producing E coli (STEC) NOT DETECTED NOT DETECTED Final   Shigella/Enteroinvasive E coli (EIEC) NOT DETECTED NOT DETECTED Final   Cryptosporidium NOT DETECTED NOT DETECTED Final   Cyclospora  cayetanensis NOT DETECTED NOT DETECTED Final   Entamoeba histolytica NOT DETECTED NOT DETECTED Final   Giardia lamblia NOT DETECTED NOT DETECTED Final   Adenovirus F40/41 NOT DETECTED NOT DETECTED Final   Astrovirus NOT DETECTED NOT DETECTED Final   Norovirus GI/GII NOT DETECTED NOT DETECTED Final   Rotavirus A NOT DETECTED NOT DETECTED Final   Sapovirus (I, II, IV, and V) NOT DETECTED NOT DETECTED Final  MRSA PCR Screening     Status: None   Collection Time: 08/11/16  2:15 PM  Result Value Ref Range Status   MRSA by PCR NEGATIVE NEGATIVE Final    Comment:        The GeneXpert MRSA Assay (FDA approved for NASAL specimens only), is one component of a comprehensive MRSA colonization surveillance program. It is not intended to diagnose MRSA infection nor to guide or monitor treatment for MRSA infections.     Radiology Studies: No results found.   Scheduled Meds: . enoxaparin (LOVENOX) injection  40 mg Subcutaneous Q24H  . nicotine  21 mg Transdermal Daily   Continuous Infusions: . 0.9 % NaCl with KCl 20 mEq / L Stopped (08/12/16 1458)  . azithromycin Stopped (08/12/16 1128)  . levofloxacin (LEVAQUIN) IV Stopped (08/11/16 1810)     LOS: 2 days   Time spent: Total of 25 minutes spent with pt, greater than 50% of which was spent in discussion of  treatment, counseling and coordination of care  [name], MD Pager: Text Page via [email or web address]  [phone]  If 7PM-7AM, please contact night-coverage [email or web address] Password [email or web address] 08/12/2016, 4:00 PM

## 2016-08-13 ENCOUNTER — Observation Stay (HOSPITAL_COMMUNITY)
Admission: EM | Admit: 2016-08-13 | Discharge: 2016-08-14 | Disposition: A | Discharge status: Home / Self Care | Payer: Self-pay | Attending: Internal Medicine | Admitting: Internal Medicine

## 2016-08-13 ENCOUNTER — Emergency Department (HOSPITAL_COMMUNITY): Payer: Self-pay

## 2016-08-13 ENCOUNTER — Encounter (HOSPITAL_COMMUNITY): Payer: Self-pay | Admitting: Emergency Medicine

## 2016-08-13 DIAGNOSIS — R0603 Acute respiratory distress: Secondary | ICD-10-CM

## 2016-08-13 DIAGNOSIS — A481 Legionnaires' disease: Principal | ICD-10-CM

## 2016-08-13 DIAGNOSIS — R6510 Systemic inflammatory response syndrome (SIRS) of non-infectious origin without acute organ dysfunction: Secondary | ICD-10-CM

## 2016-08-13 DIAGNOSIS — J189 Pneumonia, unspecified organism: Secondary | ICD-10-CM | POA: Insufficient documentation

## 2016-08-13 DIAGNOSIS — Z720 Tobacco use: Secondary | ICD-10-CM

## 2016-08-13 DIAGNOSIS — F17210 Nicotine dependence, cigarettes, uncomplicated: Secondary | ICD-10-CM | POA: Insufficient documentation

## 2016-08-13 DIAGNOSIS — D72829 Elevated white blood cell count, unspecified: Secondary | ICD-10-CM

## 2016-08-13 HISTORY — DX: Legionnaires' disease: A48.1

## 2016-08-13 HISTORY — DX: Other chronic pain: G89.29

## 2016-08-13 HISTORY — DX: Pneumonia, unspecified organism: J18.9

## 2016-08-13 HISTORY — DX: Low back pain: M54.5

## 2016-08-13 HISTORY — DX: Gastro-esophageal reflux disease without esophagitis: K21.9

## 2016-08-13 HISTORY — DX: Low back pain, unspecified: M54.50

## 2016-08-13 LAB — COMPREHENSIVE METABOLIC PANEL
ALK PHOS: 92 U/L (ref 38–126)
ALT: 48 U/L (ref 17–63)
AST: 50 U/L — ABNORMAL HIGH (ref 15–41)
Albumin: 3 g/dL — ABNORMAL LOW (ref 3.5–5.0)
Anion gap: 10 (ref 5–15)
BUN: 9 mg/dL (ref 6–20)
CALCIUM: 8.8 mg/dL — AB (ref 8.9–10.3)
CHLORIDE: 104 mmol/L (ref 101–111)
CO2: 22 mmol/L (ref 22–32)
CREATININE: 1.1 mg/dL (ref 0.61–1.24)
Glucose, Bld: 104 mg/dL — ABNORMAL HIGH (ref 65–99)
Potassium: 3.2 mmol/L — ABNORMAL LOW (ref 3.5–5.1)
Sodium: 136 mmol/L (ref 135–145)
Total Bilirubin: 2.1 mg/dL — ABNORMAL HIGH (ref 0.3–1.2)
Total Protein: 7 g/dL (ref 6.5–8.1)

## 2016-08-13 LAB — CBC WITH DIFFERENTIAL/PLATELET
BASOS PCT: 0 %
Basophils Absolute: 0 10*3/uL (ref 0.0–0.1)
EOS ABS: 0.2 10*3/uL (ref 0.0–0.7)
Eosinophils Relative: 1 %
HCT: 34.6 % — ABNORMAL LOW (ref 39.0–52.0)
Hemoglobin: 11.6 g/dL — ABNORMAL LOW (ref 13.0–17.0)
Lymphocytes Relative: 10 %
Lymphs Abs: 2 10*3/uL (ref 0.7–4.0)
MCH: 29.7 pg (ref 26.0–34.0)
MCHC: 33.5 g/dL (ref 30.0–36.0)
MCV: 88.7 fL (ref 78.0–100.0)
Monocytes Absolute: 3 10*3/uL — ABNORMAL HIGH (ref 0.1–1.0)
Monocytes Relative: 15 %
Neutro Abs: 14.1 10*3/uL — ABNORMAL HIGH (ref 1.7–7.7)
Neutrophils Relative %: 74 %
Platelets: 282 10*3/uL (ref 150–400)
RBC: 3.9 MIL/uL — AB (ref 4.22–5.81)
RDW: 12.5 % (ref 11.5–15.5)
WBC: 19.3 10*3/uL — AB (ref 4.0–10.5)

## 2016-08-13 LAB — CBC
HCT: 33.7 % — ABNORMAL LOW (ref 39.0–52.0)
HEMOGLOBIN: 11.1 g/dL — AB (ref 13.0–17.0)
MCH: 29.4 pg (ref 26.0–34.0)
MCHC: 32.9 g/dL (ref 30.0–36.0)
MCV: 89.4 fL (ref 78.0–100.0)
Platelets: 261 10*3/uL (ref 150–400)
RBC: 3.77 MIL/uL — AB (ref 4.22–5.81)
RDW: 12.5 % (ref 11.5–15.5)
WBC: 18.2 10*3/uL — ABNORMAL HIGH (ref 4.0–10.5)

## 2016-08-13 LAB — RAPID URINE DRUG SCREEN, HOSP PERFORMED: BARBITURATES: NOT DETECTED

## 2016-08-13 LAB — LACTIC ACID, PLASMA: LACTIC ACID, VENOUS: 1.1 mmol/L (ref 0.5–1.9)

## 2016-08-13 LAB — CREATININE, SERUM
Creatinine, Ser: 1.13 mg/dL (ref 0.61–1.24)
GFR calc Af Amer: >60 mL/min (ref >60)
GFR calc non Af Amer: >60 mL/min (ref >60)

## 2016-08-13 MED ORDER — POTASSIUM CHLORIDE CRYS ER 20 MEQ PO TBCR
40.0000 meq | EXTENDED_RELEASE_TABLET | Freq: Every day | ORAL | Status: DC
  Administered 2016-08-13 – 2016-08-14 (×2): 40 meq via ORAL
  Filled 2016-08-13 (×2): qty 2

## 2016-08-13 MED ORDER — METHOCARBAMOL 1000 MG/10ML IJ SOLN
1000.0000 mg | Freq: Four times a day (QID) | INTRAVENOUS | Status: DC | PRN
  Administered 2016-08-13 – 2016-08-14 (×2): 1000 mg via INTRAVENOUS
  Filled 2016-08-13 (×3): qty 10

## 2016-08-13 MED ORDER — PNEUMOCOCCAL VAC POLYVALENT 25 MCG/0.5ML IJ INJ
0.5000 mL | INJECTION | INTRAMUSCULAR | Status: AC
  Administered 2016-08-14: 0.5 mL via INTRAMUSCULAR
  Filled 2016-08-13: qty 0.5

## 2016-08-13 MED ORDER — DEXTROSE 5 % IV SOLN: 500.0000 mg | Freq: Once | INTRAVENOUS | Status: DC

## 2016-08-13 MED ORDER — KETOROLAC TROMETHAMINE 30 MG/ML IJ SOLN
30.0000 mg | Freq: Four times a day (QID) | INTRAMUSCULAR | Status: DC | PRN
  Administered 2016-08-13 – 2016-08-14 (×2): 30 mg via INTRAVENOUS
  Filled 2016-08-13 (×2): qty 1

## 2016-08-13 MED ORDER — SODIUM CHLORIDE 0.9% FLUSH
10.0000 mL | INTRAVENOUS | Status: DC | PRN
  Administered 2016-08-14: 10 mL
  Filled 2016-08-13: qty 40

## 2016-08-13 MED ORDER — NICOTINE 21 MG/24HR TD PT24
21.0000 mg | MEDICATED_PATCH | Freq: Every day | TRANSDERMAL | Status: DC
  Administered 2016-08-13 – 2016-08-14 (×2): 21 mg via TRANSDERMAL
  Filled 2016-08-13 (×2): qty 1

## 2016-08-13 MED ORDER — ENOXAPARIN SODIUM 40 MG/0.4ML ~~LOC~~ SOLN
40.0000 mg | SUBCUTANEOUS | Status: DC
  Administered 2016-08-13: 40 mg via SUBCUTANEOUS
  Filled 2016-08-13: qty 0.4

## 2016-08-13 MED ORDER — LEVOFLOXACIN IN D5W 750 MG/150ML IV SOLN
750.0000 mg | Freq: Once | INTRAVENOUS | Status: AC
  Administered 2016-08-13: 750 mg via INTRAVENOUS
  Filled 2016-08-13: qty 150

## 2016-08-13 MED ORDER — SIMETHICONE 80 MG PO CHEW: 80.0000 mg | CHEWABLE_TABLET | Freq: Four times a day (QID) | ORAL | Status: DC | PRN

## 2016-08-13 MED ORDER — ONDANSETRON HCL 4 MG/2ML IJ SOLN
4.0000 mg | Freq: Once | INTRAMUSCULAR | Status: AC
  Administered 2016-08-13: 4 mg via INTRAVENOUS
  Filled 2016-08-13: qty 2

## 2016-08-13 MED ORDER — ACETAMINOPHEN 325 MG PO TABS: 650.0000 mg | ORAL_TABLET | ORAL | Status: DC | PRN

## 2016-08-13 MED ORDER — ALBUTEROL SULFATE (2.5 MG/3ML) 0.083% IN NEBU
2.5000 mg | INHALATION_SOLUTION | RESPIRATORY_TRACT | Status: DC
  Administered 2016-08-13 (×2): 2.5 mg via RESPIRATORY_TRACT
  Filled 2016-08-13 (×2): qty 3

## 2016-08-13 MED ORDER — SODIUM CHLORIDE 0.9 % IV SOLN
INTRAVENOUS | Status: DC
  Administered 2016-08-13: 23:00:00 via INTRAVENOUS

## 2016-08-13 MED ORDER — LEVOFLOXACIN IN D5W 750 MG/150ML IV SOLN
750.0000 mg | INTRAVENOUS | Status: DC
  Administered 2016-08-14: 750 mg via INTRAVENOUS
  Filled 2016-08-13: qty 150

## 2016-08-13 MED ORDER — ONDANSETRON HCL 4 MG/2ML IJ SOLN
4.0000 mg | Freq: Four times a day (QID) | INTRAMUSCULAR | Status: DC | PRN
Start: 2016-08-13 — End: 2016-08-14

## 2016-08-13 MED ORDER — HYDROMORPHONE HCL 1 MG/ML IJ SOLN
1.0000 mg | Freq: Once | INTRAMUSCULAR | Status: AC
  Administered 2016-08-13: 1 mg via INTRAVENOUS
  Filled 2016-08-13: qty 1

## 2016-08-13 MED ORDER — SODIUM CHLORIDE 0.9% FLUSH
10.0000 mL | Freq: Two times a day (BID) | INTRAVENOUS | Status: DC
  Administered 2016-08-13: 10 mL

## 2016-08-13 MED ORDER — IPRATROPIUM-ALBUTEROL 0.5-2.5 (3) MG/3ML IN SOLN
3.0000 mL | Freq: Once | RESPIRATORY_TRACT | Status: AC
  Administered 2016-08-13: 3 mL via RESPIRATORY_TRACT
  Filled 2016-08-13: qty 3

## 2016-08-13 MED ORDER — NICOTINE POLACRILEX 2 MG MT GUM
2.0000 mg | CHEWING_GUM | OROMUCOSAL | Status: DC | PRN
  Administered 2016-08-13: 2 mg via ORAL
  Filled 2016-08-13 (×2): qty 1

## 2016-08-13 MED ORDER — SODIUM CHLORIDE 0.9 % IV SOLN: INTRAVENOUS | Status: DC

## 2016-08-13 MED ORDER — ZOLPIDEM TARTRATE 5 MG PO TABS: 5.0000 mg | ORAL_TABLET | Freq: Every evening | ORAL | Status: DC | PRN

## 2016-08-13 NOTE — Progress Notes (Addendum)
Pt rushed to hallway using profanity to staff regarding not getting a soda and blanket in a prompt manner. Pt roaming hallways. This RN tried to calm pt and meet his needs. However, pt states "I want to be discharged." This RN explained MD did not discharge him because he does not feel pt is ready to be discharged. This nurse explained option to leave AMA, and that insurance will not cover this visit. Pt agreed to this, stating "that's fine." On call made aware. This RN discontinued IV and had pt sign AMA paperwork. Pt alert and oriented. Pt walked hisself to elevator and left unit.

## 2016-08-13 NOTE — ED Triage Notes (Signed)
Pt arrives from home, reports leaving [facility] AMA last night while being tx for PNA.  Pt states, "I just want to get my rx to take at home." Pt tachypnic, tachycardic. Breath sounds clear.

## 2016-08-13 NOTE — ED Provider Notes (Signed)
[facility]-EMERGENCY DEPT Provider Note   CSN: [id number] Arrival date & time: 08/13/16  0703     History   Chief Complaint Chief Complaint  Patient presents with  . Shortness of Breath    HPI Louis Hoffman is a 31 y.o. male.  The patient known to me. I admitted him over [facility] on Monday for potential sepsis and community-acquired pneumonia. Patient left AMA from the inpatient service at [facility] long early this morning. Got home still did not feel well so has returned. Patient arrived here tachycardic to kick neck difficulty breathing. Not hypoxic. Patient records show that they confirmed Legionella patient was on Levaquin and Zithromax.      History reviewed. No pertinent past medical history.  Patient Active Problem List   Diagnosis Date Noted  . Lobar pneumonia (HCC) 08/11/2016  . Intractable vomiting 08/11/2016  . Hyperbilirubinemia 08/11/2016  . Hyponatremia 08/11/2016  . Tobacco abuse 08/11/2016  . Cannabis abuse 08/11/2016  . Sepsis (HCC) 08/10/2016  . Leukocytosis 08/10/2016  . Hypokalemia 08/10/2016    History reviewed. No pertinent surgical history.     Home Medications    Prior to Admission medications   Medication Sig Start Date End Date Taking? Authorizing Provider  acyclovir (ZOVIRAX) 400 MG tablet Take 2 tablets (800 mg total) by mouth 2 (two) times daily. Patient not taking: Reported on 08/10/2016 02/07/15   Joycie Peek, PA-C  ibuprofen (ADVIL,MOTRIN) 200 MG tablet Take 400-600 mg by mouth every 6 (six) hours as needed for fever or moderate pain.    [provider]    Family History Family History  Problem Relation Age of Onset  . Hypertension Mother   . Gout Father   . Hypertension Father     Social History Social History  Substance Use Topics  . Smoking status: Current Every Day Smoker    Packs/day: 1.00    Types: Cigarettes  . Smokeless tobacco: Never Used  . Alcohol use No     Allergies   Patient has no  known allergies.   Review of Systems Review of Systems  Constitutional: Negative for fever.  HENT: Negative for congestion.   Eyes: Negative for visual disturbance.  Respiratory: Positive for shortness of breath.   Gastrointestinal: Negative for abdominal pain.  Genitourinary: Negative for dysuria.  Musculoskeletal: Positive for back pain.  Skin: Negative for rash.  Neurological: Negative for headaches.  Hematological: Does not bruise/bleed easily.  Psychiatric/Behavioral: Negative for confusion.     Physical Exam Updated Vital Signs BP (!) 149/93   Pulse (!) 104   Temp 99.1 F (37.3 C) (Oral)   Resp (!) 37   Ht 1.676 m (5\' 6" )   Wt 109.3 kg (241 lb)   SpO2 95%   BMI 38.90 kg/m   Physical Exam  Constitutional: He is oriented to person, place, and time. He appears well-developed and well-nourished. He appears distressed.  HENT:  Head: Normocephalic and atraumatic.  Eyes: Conjunctivae and EOM are normal. Pupils are equal, round, and reactive to light.  Neck: Normal range of motion. Neck supple.  Cardiovascular: Regular rhythm.   Tachycardic  Pulmonary/Chest: He is in respiratory distress. He has wheezes.  Abdominal: Soft. Bowel sounds are normal. There is no tenderness.  Musculoskeletal: Normal range of motion.  Neurological: He is alert and oriented to person, place, and time. No cranial nerve deficit or sensory deficit. He exhibits normal muscle tone. Coordination normal.  Skin: Skin is warm. He is not diaphoretic.  Nursing note and vitals  reviewed.    ED Treatments / Results  Labs (all labs ordered are listed, but only abnormal results are displayed) Labs Reviewed  COMPREHENSIVE METABOLIC PANEL - Abnormal; Notable for the following:       Result Value   Potassium 3.2 (*)    Glucose, Bld 104 (*)    Calcium 8.8 (*)    Albumin 3.0 (*)    AST 50 (*)    Total Bilirubin 2.1 (*)    All other components within normal limits  CBC WITH DIFFERENTIAL/PLATELET -  Abnormal; Notable for the following:    WBC 19.3 (*)    RBC 3.90 (*)    Hemoglobin 11.6 (*)    HCT 34.6 (*)    Neutro Abs 14.1 (*)    Monocytes Absolute 3.0 (*)    All other components within normal limits    EKG  EKG Interpretation  Date/Time:  Thursday August 13 2016 10:13:31 EDT Ventricular Rate:  94 PR Interval:    QRS Duration: 86 QT Interval:  359 QTC Calculation: 449 R Axis:   93 Text Interpretation:  Sinus rhythm Borderline right axis deviation Nonspecific repol abnormality, inferior leads Borderline ST elevation, lateral leads No significant change since last tracing Confirmed by Vanetta MuldersZackowski, Jenaveve Fenstermaker [phone]([id number]) on 08/13/2016 10:33:20 AM       Radiology Dg Chest 2 View  Result Date: 08/13/2016 CLINICAL DATA:  Cough.  Recent pneumonia EXAM: CHEST  2 VIEW COMPARISON:  Chest CT August 10, 2016 FINDINGS: There is a extensive airspace consolidation throughout the posterior segment of the right upper lobe. There is more patchy infiltrate in a portion of the anterior and apical segments of the right upper lobe as well. Lungs elsewhere clear. Heart size and pulmonary vascular normal. No adenopathy. No bone lesions. IMPRESSION: Widespread right upper lobe pneumonia, most pronounced in the posterior segment. Lungs elsewhere clear. Cardiac silhouette within normal limits. No evident adenopathy. Electronically Signed   By: Bretta BangWilliam  Woodruff III M.D.   On: 08/13/2016 09:14    Procedures Procedures (including critical care time)  Medications Ordered in ED Medications  0.9 %  sodium chloride infusion (not administered)  levofloxacin (LEVAQUIN) IVPB 750 mg (750 mg Intravenous New Bag/Given 08/13/16 1030)  0.9 %  sodium chloride infusion (not administered)  ipratropium-albuterol (DUONEB) 0.5-2.5 (3) MG/3ML nebulizer solution 3 mL (not administered)  nicotine (NICODERM CQ - dosed in mg/24 hours) patch 21 mg (not administered)  nicotine polacrilex (NICORETTE) gum 2 mg (not administered)    ondansetron (ZOFRAN) injection 4 mg (4 mg Intravenous Given 08/13/16 1051)  HYDROmorphone (DILAUDID) injection 1 mg (1 mg Intravenous Given 08/13/16 1051)     Initial Impression / Assessment and Plan / ED Course  I have reviewed the triage vital signs and the nursing notes.  Pertinent labs & imaging results that were available during my care of the patient were reviewed by me and considered in my medical decision making (see chart for details).     Outpatient showing shortness of breath tachypnea tachycardic improves some with rest. Bilateral rhonchi also with some wheezing. Patient with known Legionella. Will restart the Levaquin. Reviewed and up-to-date. Levaquin and Zithromax are appropriate Levaquin tends to be a little bit better. Consult with hospitalist they will readmit. Patient clearly not ready for outpatient therapy at this point in time.  Final Clinical Impressions(s) / ED Diagnoses   Final diagnoses:  Legionella pneumonia (HCC)    New Prescriptions New Prescriptions   No medications on file     Stuarts DraftZackowski,  Lorin Picket, MD 08/13/16 1104

## 2016-08-13 NOTE — Progress Notes (Signed)
Louis FantasiaJohnnie Hoffman is a 31 y.o. male patient admitted from ED awake, alert - oriented  X 4 - no acute distress noted.  VSS - Blood pressure (!) 154/94, pulse 75, temperature 99.7 F (37.6 C), temperature source Oral, resp. rate (!) 22, height 5\' 6"  (1.676 m), weight 109.3 kg (241 lb), SpO2 (!) 47 %.    IV in place, occlusive dsg intact without redness.  Orientation to room, and floor completed with information packet given to patient/family.  Patient declined safety video at this time.  Admission INP armband ID verified with patient/family, and in place.   SR up x 2, fall assessment complete, with patient and family able to verbalize understanding of risk associated with falls, and verbalized understanding to call nsg before up out of bed.  Call light within reach, patient able to voice, and demonstrate understanding.  Skin, clean-dry- intact without evidence of bruising, or skin tears.   No evidence of skin break down noted on exam.     Will cont to eval and treat per MD orders.  Eligah EastErin M [name], RN 08/13/2016 2:51 PM

## 2016-08-13 NOTE — Progress Notes (Signed)
Pt complaing of throbbing back pain 10/10 offered pt tylenol pt refused saying it wouldnt work Md notified.

## 2016-08-13 NOTE — Discharge Summary (Signed)
AMA This event occurred overnight and warning was done by nursing staff.  Patient at this time expresses desire to leave the Hospital immidiately, patient has been warned that this is not Medically advisable at this time, and can result in Medical complications like Death and Disability, patient understands and accepts the risks involved and assumes full responsibilty of this decision.  Louis Hoffman M.D on 08/13/2016 at 11:58 AM  [facility]  Last Note Below  PROGRESS NOTE [facility]   Louis Hoffman              OZH:[id number] DOB: 01-27-1986  DOA: 08/10/2016 PCP: Patient, No Pcp Per        Brief Narrative:  Louis Hoffman is a 31 y.o. male with medical history of tobacco and cannabis use and no other chronic medical problems presented with history of generalized weakness and nausea vomiting or diarrhea with lower abdominal pain for 5 days prior to admissions. In the ED he was found to have temperature of 103.3 with elevated heart rate to the 120s and WBC of 19.1. CT of the chest show right upper lobe consolidation with small follicle opacities in the right middle lobe. Patient was admitted for pneumonia which was found to be positive Legionella. Patient started on IV antibiotics.   Of note patient report using and old portable air-conditioner for 2-3 days while his air system was repaired.   Subjective: Patient seen and examined, complaining of mild cough. He was febrile overnight. WBC slightly up today. No other complaints.   Assessment & Plan: Sepsis due to right multilobar pneumonia secondary to Legionella Legionella antigen positive Patient initially started on Zosyn and vancomycin which were discontinued and switched to Levaquin and azithromycin MRSA screen negative Influenza PCR negative Vital respiratory panel  negative HIV negative  Elevated Bili  Likely due to infectious process  Normal gallbladder w/o ductal dilation on CT  ? Gilbert's Hep B negative   Hypokalemia  Replete  Check K in AM   Back pain - improving  Awaiting for MRI of L spine  Pain medication adjusted  Monitor   Tobacco and cannabis abuse  Cessation discussed  Patient interested on Nicoderm   DVT prophylaxis: Lovenox  Code Status: FULL  Family Communication: Friend at bedside  Disposition Plan: Home in 24-48 hrs   Consultants:   None   Procedures:   None   Antimicrobials:            Anti-infectives     Start        Dose/Rate  Route  Frequency  Ordered  Stop    08/12/16 1800   azithromycin (ZITHROMAX) tablet 500 mg  Status:  Discontinued      500 mg  Oral  Daily  08/11/16 1607  08/11/16 1613    08/12/16 1000   azithromycin (ZITHROMAX) 500 mg in dextrose 5 % 250 mL IVPB      500 mg  250 mL/hr over 60 Minutes  Intravenous  Every 24 hours  08/12/16 0814  08/15/16 0959    08/11/16 1800   azithromycin (ZITHROMAX) 1,000 mg in dextrose 5 % 250 mL IVPB  Status:  Discontinued  1,000 mg  250 mL/hr over 60 Minutes  Intravenous   Once  08/11/16 1607  08/11/16 1613    08/11/16 1700   azithromycin (ZITHROMAX) tablet 500 mg  Status:  Discontinued      500 mg  Oral  Daily  08/11/16 1604  08/11/16 1607    08/11/16 1700   levofloxacin (LEVAQUIN) IVPB 750 mg      750 mg  100 mL/hr over 90 Minutes  Intravenous  Every 24 hours  08/11/16 1613       08/11/16 1200   vancomycin (VANCOCIN) IVPB 1000 mg/200 mL premix  Status:  Discontinued      1,000 mg  200 mL/hr over 60 Minutes  Intravenous  Every 8 hours  08/11/16 1028  08/11/16 1610    08/11/16 0200   vancomycin (VANCOCIN) IVPB 1000 mg/200 mL premix  Status:  Discontinued      1,000 mg  200 mL/hr over 60 Minutes  Intravenous  Every 12 hours  08/10/16 1201  08/11/16 1028    08/10/16  2000   piperacillin-tazobactam (ZOSYN) IVPB 3.375 g  Status:  Discontinued      3.375 g  12.5 mL/hr over 240 Minutes  Intravenous  Every 8 hours  08/10/16 1201  08/12/16 0814    08/10/16 1215   vancomycin (VANCOCIN) 2,000 mg in sodium chloride 0.9 % 500 mL IVPB      2,000 mg  250 mL/hr over 120 Minutes  Intravenous   Once  08/10/16 1201  08/10/16 1515    08/10/16 1145   piperacillin-tazobactam (ZOSYN) IVPB 3.375 g      3.375 g  100 mL/hr over 30 Minutes  Intravenous   Once  08/10/16 1136  08/10/16 1654    08/10/16 1145   vancomycin (VANCOCIN) IVPB 1000 mg/200 mL premix  Status:  Discontinued      1,000 mg  200 mL/hr over 60 Minutes  Intravenous   Once  08/10/16 1136  08/10/16 1201        Objective:       Vitals:   08/12/16 0249 08/12/16 0430 08/12/16 1252 08/12/16 1459  BP:  (!) 134/91    Pulse:  (!) 112    Resp:  (!) 24    Temp: (!) 100.7 F (38.2 C) (!) 101.9 F (38.8 C) (!) 101.4 F (38.6 C) 99.1 F (37.3 C)  TempSrc: Oral Oral  Oral  SpO2:  96%    Weight:  109.4 kg (241 lb 2.9 oz)    Height:        Intake/Output Summary (Last 24 hours) at 08/12/16 1600 Last data filed at 08/12/16 1400  Gross per 24 hour  Intake             4180 ml  Output             1800 ml  Net             2380 ml        Filed Weights   08/10/16 1613 08/11/16 0541 08/12/16 0430  Weight: 124.7 kg (275 lb) 112.4 kg (247 lb 12.8 oz) 109.4 kg (241 lb 2.9 oz)    Examination:  General exam: Appears calm and comfortable  HEENT: AC/AT, PERRLA, OP moist and clear Respiratory system: Good air entry, rales in the RLL, mild decrease BS in RML  Cardiovascular system: S1 & S2 heard, RRR. No JVD, murmurs, rubs or gallops Gastrointestinal system: Abdomen is nondistended, soft and nontender.  Central nervous system: Alert and  oriented. No focal neurological deficits. Extremities: No pedal edema.  Skin: No rashes, lesions or  ulcers Psychiatry: Judgement and insight appear normal. Mood & affect appropriate.   Data Reviewed: I have personally reviewed following labs and imaging studies  CBC:  Last Labs    Recent Labs Lab 08/10/16 0957 08/11/16 0115 08/12/16 0622  WBC 19.1* 15.8* 17.8*  HGB 13.3 11.5* 11.6*  HCT 38.1* 33.9* 32.6*  MCV 88.2 89.4 85.6  PLT 172 168 215     Basic Metabolic Panel:  Last Labs    Recent Labs Lab 08/10/16 0957 08/11/16 0115 08/12/16 0622  NA 132* 133* 135  K 3.2* 3.4* 3.1*  CL 101 104 100*  CO2 19* 21* 24  GLUCOSE 133* 122* 121*  BUN 9 7 6   CREATININE 1.20 1.09 0.96  CALCIUM 8.9 8.1* 8.5*  MG  --   --  1.7     GFR: Estimated Creatinine Clearance: 128.1 mL/min (by C-G formula based on SCr of 0.96 mg/dL). Liver Function Tests:  Last Labs    Recent Labs Lab 08/10/16 0957 08/11/16 1213 08/12/16 0622  AST 39  --  29  ALT 47  --  37  ALKPHOS 95  --  86  BILITOT 3.1* 5.4* 5.8*  PROT 7.3  --  6.7  ALBUMIN 3.8  --  3.0*      Last Labs    Recent Labs Lab 08/10/16 0957  LIPASE 20     CBG:  Last Labs    Recent Labs Lab 08/11/16 0749 08/12/16 0749  GLUCAP 170* 115*     Sepsis Labs:  Last Labs    Recent Labs Lab 08/10/16 1130 08/10/16 2211 08/11/16 0115  PROCALCITON  --  0.25  --   LATICACIDVEN 0.83 0.9 0.8             Recent Results (from the past 240 hour(s))  Blood Culture (routine x 2)     Status: None (Preliminary result)   Collection Time: 08/10/16 10:00 AM  Result Value Ref Range Status   Specimen Description BLOOD LEFT ANTECUBITAL  Final   Special Requests   Final    BOTTLES DRAWN AEROBIC AND ANAEROBIC Blood Culture adequate volume   Culture   Final    NO GROWTH 2 DAYS Performed at [facility], [address], [place], [place] [address]    Report Status PENDING  Incomplete  Blood Culture (routine x 2)     Status: None (Preliminary result)   Collection Time: 08/10/16 11:24 AM   Result Value Ref Range Status   Specimen Description BLOOD RIGHT ANTECUBITAL  Final   Special Requests   Final    BOTTLES DRAWN AEROBIC AND ANAEROBIC Blood Culture adequate volume   Culture   Final    NO GROWTH 2 DAYS Performed at [facility], [address], [place], [place] [address]    Report Status PENDING  Incomplete  Culture, blood (x 2)     Status: None (Preliminary result)   Collection Time: 08/10/16  4:36 PM  Result Value Ref Range Status   Specimen Description BLOOD LEFT HAND  Final   Special Requests IN PEDIATRIC BOTTLE Blood Culture adequate volume  Final   Culture   Final    NO GROWTH 2 DAYS Performed at [facility], [address], [place], [place] [address]    Report Status PENDING  Incomplete  Respiratory Panel by PCR     Status: None   Collection  Time: 08/10/16  6:02 PM  Result Value Ref Range Status   Adenovirus NOT DETECTED NOT DETECTED Final   Coronavirus 229E NOT DETECTED NOT DETECTED Final   Coronavirus HKU1 NOT DETECTED NOT DETECTED Final   Coronavirus NL63 NOT DETECTED NOT DETECTED Final   Coronavirus OC43 NOT DETECTED NOT DETECTED Final   Metapneumovirus NOT DETECTED NOT DETECTED Final   Rhinovirus / Enterovirus NOT DETECTED NOT DETECTED Final   Influenza A NOT DETECTED NOT DETECTED Final   Influenza B NOT DETECTED NOT DETECTED Final   Parainfluenza Virus 1 NOT DETECTED NOT DETECTED Final   Parainfluenza Virus 2 NOT DETECTED NOT DETECTED Final   Parainfluenza Virus 3 NOT DETECTED NOT DETECTED Final   Parainfluenza Virus 4 NOT DETECTED NOT DETECTED Final   Respiratory Syncytial Virus NOT DETECTED NOT DETECTED Final   Bordetella pertussis NOT DETECTED NOT DETECTED Final   Chlamydophila pneumoniae NOT DETECTED NOT DETECTED Final   Mycoplasma pneumoniae NOT DETECTED NOT DETECTED Final    Comment: Performed at [facility], [address], [place], [place] [address]  C difficile quick  scan w PCR reflex     Status: None   Collection Time: 08/11/16 12:20 AM  Result Value Ref Range Status   C Diff antigen NEGATIVE NEGATIVE Final   C Diff toxin NEGATIVE NEGATIVE Final   C Diff interpretation No C. difficile detected.  Final  Gastrointestinal Panel by PCR , Stool     Status: None   Collection Time: 08/11/16 12:20 AM  Result Value Ref Range Status   Campylobacter species NOT DETECTED NOT DETECTED Final   Plesimonas shigelloides NOT DETECTED NOT DETECTED Final   Salmonella species NOT DETECTED NOT DETECTED Final   Yersinia enterocolitica NOT DETECTED NOT DETECTED Final   Vibrio species NOT DETECTED NOT DETECTED Final   Vibrio cholerae NOT DETECTED NOT DETECTED Final   Enteroaggregative E coli (EAEC) NOT DETECTED NOT DETECTED Final   Enteropathogenic E coli (EPEC) NOT DETECTED NOT DETECTED Final   Enterotoxigenic E coli (ETEC) NOT DETECTED NOT DETECTED Final   Shiga like toxin producing E coli (STEC) NOT DETECTED NOT DETECTED Final   Shigella/Enteroinvasive E coli (EIEC) NOT DETECTED NOT DETECTED Final   Cryptosporidium NOT DETECTED NOT DETECTED Final   Cyclospora cayetanensis NOT DETECTED NOT DETECTED Final   Entamoeba histolytica NOT DETECTED NOT DETECTED Final   Giardia lamblia NOT DETECTED NOT DETECTED Final   Adenovirus F40/41 NOT DETECTED NOT DETECTED Final   Astrovirus NOT DETECTED NOT DETECTED Final   Norovirus GI/GII NOT DETECTED NOT DETECTED Final   Rotavirus A NOT DETECTED NOT DETECTED Final   Sapovirus (I, II, IV, and V) NOT DETECTED NOT DETECTED Final  MRSA PCR Screening     Status: None   Collection Time: 08/11/16  2:15 PM  Result Value Ref Range Status   MRSA by PCR NEGATIVE NEGATIVE Final    Comment:        The GeneXpert MRSA Assay (FDA approved for NASAL specimens only), is one component of a comprehensive MRSA colonization surveillance program. It is not intended to diagnose MRSA infection nor to guide or monitor  treatment for MRSA infections.     Radiology Studies: Imaging Results (Last 48 hours)  No results found.     Scheduled Meds: . enoxaparin (LOVENOX) injection  40 mg Subcutaneous Q24H  . nicotine  21 mg Transdermal Daily   Continuous Infusions: . 0.9 % NaCl with KCl 20 mEq / L Stopped (08/12/16 1458)  . azithromycin  Stopped (08/12/16 1128)  . levofloxacin (LEVAQUIN) IV Stopped (08/11/16 1810)     LOS: 2 days

## 2016-08-13 NOTE — H&P (Signed)
History and Physical    Louis Eley AVW:[id number] DOB: 05-22-1985 DOA: 08/13/2016  Referring MD/NP/PA: Deretha Emory PCP: Patient, No Pcp Per Outpatient Specialists: none Patient coming from: home  Chief Complaint: pneumonia, shortness of breath  HPI: Deangleo Passage is a 31 y.o. male with medical history significant of Legionella pneumonia. Pt was admitted at [facility] on 6/11 and had a positive Legionella antigen.  Pt left the hospital at 2:00 this morning AMA.  Pt reports he began feeling worse after going home. He presented to [facility] Emergency department for evaluation . Pt complains of bing short of breath.  Pt complains of coughing.  He feels like his heart has been racing.  Pt began feeling sick on 6/5 with weakness, nausea abdominal cramping and diarrhea. He had a temperature of 103.  Pt had a ct scan of his chest which showed a right upper lobe consolidation and small opacities and right middle lobe.  He had an elevated wbc count of 19.1  Today he denies abdominal pain.  He reports he feels weak and tired.  Pt reports he feels more short of breath than when he left.    ED Course: Pt was evaluated in the ED.  Chest xray shows more patchy infiltrate.  WBC count today remains elevated at 19.3    Iv fluids and levaquin IV were started in the ED.   Review of Systems: As per HPI otherwise 10 point review of systems negative.    History reviewed. No pertinent past medical history.  History reviewed. No pertinent surgical history.   reports that he has been smoking Cigarettes.  He has been smoking about 1.00 pack per day. He has never used smokeless tobacco. He reports that he uses drugs, including Marijuana. He reports that he does not drink alcohol.  No Known Allergies  Family History  Problem Relation Age of Onset  . Hypertension Mother   . Gout Father   . Hypertension Father     Prior to Admission medications   Medication Sig Start Date End Date Taking? Authorizing  Provider  acyclovir (ZOVIRAX) 400 MG tablet Take 2 tablets (800 mg total) by mouth 2 (two) times daily. Patient not taking: Reported on 08/10/2016 02/07/15   Joycie Peek, PA-C  ibuprofen (ADVIL,MOTRIN) 200 MG tablet Take 400-600 mg by mouth every 6 (six) hours as needed for fever or moderate pain.    [provider]    Physical Exam: Vitals:   08/13/16 0900 08/13/16 1014 08/13/16 1015 08/13/16 1045  BP:  (!) 145/91 (!) 145/91 (!) 149/93  Pulse: 96 98 97 (!) 104  Resp:  (!) 27 (!) 27 (!) 37  Temp:  99.1 F (37.3 C)    TempSrc:  Oral    SpO2: 97% 95% 95% 95%  Weight:      Height:          Constitutional: NAD, calm, comfortable Vitals:   08/13/16 0900 08/13/16 1014 08/13/16 1015 08/13/16 1045  BP:  (!) 145/91 (!) 145/91 (!) 149/93  Pulse: 96 98 97 (!) 104  Resp:  (!) 27 (!) 27 (!) 37  Temp:  99.1 F (37.3 C)    TempSrc:  Oral    SpO2: 97% 95% 95% 95%  Weight:      Height:       Eyes: PERRL, lids and conjunctivae normal ENMT: Mucous membranes are moist. Posterior pharynx clear of any exudate or lesions.Normal dentition.  Neck: normal, supple, no masses, no thyromegaly Respiratory: rhonchi all lobes,  wheezing all lobes  no crackles. increased respiratory effort. No accessory muscle use.  Cardiovascular: tachycardia, no murmurs / rubs / gallops. No extremity edema. 2+ pedal pulses. No carotid bruits.  Abdomen: no tenderness, no masses palpated. No hepatosplenomegaly. Bowel sounds positive.  Musculoskeletal: no clubbing / cyanosis. No joint deformity upper and lower extremities. Good ROM, no contractures. Normal muscle tone.  Skin: no rashes, lesions, ulcers. No induration Neurologic: CN 2-12 grossly intact. Sensation intact, DTR normal. Strength 5/5 in all 4.  Psychiatric: Normal judgment and insight. Alert and oriented x 3. Normal mood.     Labs on Admission: I have personally reviewed following labs and imaging studies  CBC:  Recent Labs Lab  08/10/16 0957 08/11/16 0115 08/12/16 0622 08/13/16 0938  WBC 19.1* 15.8* 17.8* 19.3*  NEUTROABS  --   --   --  14.1*  HGB 13.3 11.5* 11.6* 11.6*  HCT 38.1* 33.9* 32.6* 34.6*  MCV 88.2 89.4 85.6 88.7  PLT 172 168 215 282   Basic Metabolic Panel:  Recent Labs Lab 08/10/16 0957 08/11/16 0115 08/12/16 0622 08/13/16 0938  NA 132* 133* 135 136  K 3.2* 3.4* 3.1* 3.2*  CL 101 104 100* 104  CO2 19* 21* 24 22  GLUCOSE 133* 122* 121* 104*  BUN 9 7 6 9   CREATININE 1.20 1.09 0.96 1.10  CALCIUM 8.9 8.1* 8.5* 8.8*  MG  --   --  1.7  --    GFR: Estimated Creatinine Clearance: 111.8 mL/min (by C-G formula based on SCr of 1.1 mg/dL). Liver Function Tests:  Recent Labs Lab 08/10/16 0957 08/11/16 1213 08/12/16 0622 08/13/16 0938  AST 39  --  29 50*  ALT 47  --  37 48  ALKPHOS 95  --  86 92  BILITOT 3.1* 5.4* 5.8* 2.1*  PROT 7.3  --  6.7 7.0  ALBUMIN 3.8  --  3.0* 3.0*    Recent Labs Lab 08/10/16 0957  LIPASE 20   No results for input(s): AMMONIA in the last 168 hours. Coagulation Profile: No results for input(s): INR, PROTIME in the last 168 hours. Cardiac Enzymes: No results for input(s): CKTOTAL, CKMB, CKMBINDEX, TROPONINI in the last 168 hours. BNP (last 3 results) No results for input(s): PROBNP in the last 8760 hours. HbA1C: No results for input(s): HGBA1C in the last 72 hours. CBG:  Recent Labs Lab 08/11/16 0749 08/12/16 0749  GLUCAP 170* 115*   Lipid Profile: No results for input(s): CHOL, HDL, LDLCALC, TRIG, CHOLHDL, LDLDIRECT in the last 72 hours. Thyroid Function Tests: No results for input(s): TSH, T4TOTAL, FREET4, T3FREE, THYROIDAB in the last 72 hours. Anemia Panel: No results for input(s): VITAMINB12, FOLATE, FERRITIN, TIBC, IRON, RETICCTPCT in the last 72 hours. Urine analysis:    Component Value Date/Time   COLORURINE AMBER (A) 08/10/2016 0849   APPEARANCEUR CLEAR 08/10/2016 0849   LABSPEC 1.027 08/10/2016 0849   PHURINE 5.0 08/10/2016  0849   GLUCOSEU NEGATIVE 08/10/2016 0849   HGBUR NEGATIVE 08/10/2016 0849   BILIRUBINUR NEGATIVE 08/10/2016 0849   KETONESUR NEGATIVE 08/10/2016 0849   PROTEINUR 100 (A) 08/10/2016 0849   NITRITE NEGATIVE 08/10/2016 0849   LEUKOCYTESUR NEGATIVE 08/10/2016 0849   Sepsis Labs: @LABRCNTIP (procalcitonin:4,lacticidven:4) ) Recent Results (from the past 240 hour(s))  Blood Culture (routine x 2)     Status: None (Preliminary result)   Collection Time: 08/10/16 10:00 AM  Result Value Ref Range Status   Specimen Description BLOOD LEFT ANTECUBITAL  Final   Special Requests  Final    BOTTLES DRAWN AEROBIC AND ANAEROBIC Blood Culture adequate volume   Culture   Final    NO GROWTH 2 DAYS Performed at [facility], [address] [place], [place] [address]    Report Status PENDING  Incomplete  Blood Culture (routine x 2)     Status: None (Preliminary result)   Collection Time: 08/10/16 11:24 AM  Result Value Ref Range Status   Specimen Description BLOOD RIGHT ANTECUBITAL  Final   Special Requests   Final    BOTTLES DRAWN AEROBIC AND ANAEROBIC Blood Culture adequate volume   Culture   Final    NO GROWTH 2 DAYS Performed at [facility], [address] [place], [place] [address]    Report Status PENDING  Incomplete  Culture, blood (x 2)     Status: None (Preliminary result)   Collection Time: 08/10/16  4:36 PM  Result Value Ref Range Status   Specimen Description BLOOD LEFT HAND  Final   Special Requests IN PEDIATRIC BOTTLE Blood Culture adequate volume  Final   Culture   Final    NO GROWTH 2 DAYS Performed at [facility], [address], [place], [place] [address]    Report Status PENDING  Incomplete  Respiratory Panel by PCR     Status: None   Collection Time: 08/10/16  6:02 PM  Result Value Ref Range Status   Adenovirus NOT DETECTED NOT DETECTED Final   Coronavirus 229E NOT DETECTED NOT DETECTED Final   Coronavirus HKU1 NOT DETECTED NOT DETECTED Final    Coronavirus NL63 NOT DETECTED NOT DETECTED Final   Coronavirus OC43 NOT DETECTED NOT DETECTED Final   Metapneumovirus NOT DETECTED NOT DETECTED Final   Rhinovirus / Enterovirus NOT DETECTED NOT DETECTED Final   Influenza A NOT DETECTED NOT DETECTED Final   Influenza B NOT DETECTED NOT DETECTED Final   Parainfluenza Virus 1 NOT DETECTED NOT DETECTED Final   Parainfluenza Virus 2 NOT DETECTED NOT DETECTED Final   Parainfluenza Virus 3 NOT DETECTED NOT DETECTED Final   Parainfluenza Virus 4 NOT DETECTED NOT DETECTED Final   Respiratory Syncytial Virus NOT DETECTED NOT DETECTED Final   Bordetella pertussis NOT DETECTED NOT DETECTED Final   Chlamydophila pneumoniae NOT DETECTED NOT DETECTED Final   Mycoplasma pneumoniae NOT DETECTED NOT DETECTED Final    Comment: Performed at [facility], [address] [place], [place] [address]  C difficile quick scan w PCR reflex     Status: None   Collection Time: 08/11/16 12:20 AM  Result Value Ref Range Status   C Diff antigen NEGATIVE NEGATIVE Final   C Diff toxin NEGATIVE NEGATIVE Final   C Diff interpretation No C. difficile detected.  Final  Gastrointestinal Panel by PCR , Stool     Status: None   Collection Time: 08/11/16 12:20 AM  Result Value Ref Range Status   Campylobacter species NOT DETECTED NOT DETECTED Final   Plesimonas shigelloides NOT DETECTED NOT DETECTED Final   Salmonella species NOT DETECTED NOT DETECTED Final   Yersinia enterocolitica NOT DETECTED NOT DETECTED Final   Vibrio species NOT DETECTED NOT DETECTED Final   Vibrio cholerae NOT DETECTED NOT DETECTED Final   Enteroaggregative E coli (EAEC) NOT DETECTED NOT DETECTED Final   Enteropathogenic E coli (EPEC) NOT DETECTED NOT DETECTED Final   Enterotoxigenic E coli (ETEC) NOT DETECTED NOT DETECTED Final   Shiga like toxin producing E coli (STEC) NOT DETECTED NOT DETECTED Final  Shigella/Enteroinvasive E coli (EIEC) NOT DETECTED NOT DETECTED Final   Cryptosporidium  NOT DETECTED NOT DETECTED Final   Cyclospora cayetanensis NOT DETECTED NOT DETECTED Final   Entamoeba histolytica NOT DETECTED NOT DETECTED Final   Giardia lamblia NOT DETECTED NOT DETECTED Final   Adenovirus F40/41 NOT DETECTED NOT DETECTED Final   Astrovirus NOT DETECTED NOT DETECTED Final   Norovirus GI/GII NOT DETECTED NOT DETECTED Final   Rotavirus A NOT DETECTED NOT DETECTED Final   Sapovirus (I, II, IV, and V) NOT DETECTED NOT DETECTED Final  MRSA PCR Screening     Status: None   Collection Time: 08/11/16  2:15 PM  Result Value Ref Range Status   MRSA by PCR NEGATIVE NEGATIVE Final    Comment:        The GeneXpert MRSA Assay (FDA approved for NASAL specimens only), is one component of a comprehensive MRSA colonization surveillance program. It is not intended to diagnose MRSA infection nor to guide or monitor treatment for MRSA infections.      Radiological Exams on Admission: Dg Chest 2 View  Result Date: 08/13/2016 CLINICAL DATA:  Cough.  Recent pneumonia EXAM: CHEST  2 VIEW COMPARISON:  Chest CT August 10, 2016 FINDINGS: There is a extensive airspace consolidation throughout the posterior segment of the right upper lobe. There is more patchy infiltrate in a portion of the anterior and apical segments of the right upper lobe as well. Lungs elsewhere clear. Heart size and pulmonary vascular normal. No adenopathy. No bone lesions. IMPRESSION: Widespread right upper lobe pneumonia, most pronounced in the posterior segment. Lungs elsewhere clear. Cardiac silhouette within normal limits. No evident adenopathy. Electronically Signed   By: Bretta BangWilliam  Woodruff III M.D.   On: 08/13/2016 09:14    EKG: Independently reviewed.   Assessment/Plan  Legionella Pneumonia   Levaquin Iv restarted.  Albuterol nebulizations for wheezing 02 at 2 liters for hypoxia  Hypokalemia Potassium supplementation po   Hyperbilirubinemia  Improving will continue to monitor   Tobacco and Marijuana  use     DVT prophylaxis: Lovenox Code Status: Full Family Communication: No family present Disposition Plan: 6/16 pending improvement Consults called:  Admission status: Inpatient admission   Langston MaskerKaren Burley Kopka MD [facility] Pager [phone]  If 7PM-7AM, please contact night-coverage [email or web address] Password [email or web address]  08/13/2016, 11:01 AM

## 2016-08-14 ENCOUNTER — Inpatient Hospital Stay (HOSPITAL_COMMUNITY): Payer: Self-pay

## 2016-08-14 DIAGNOSIS — A481 Legionnaires' disease: Principal | ICD-10-CM

## 2016-08-14 LAB — COMPREHENSIVE METABOLIC PANEL
ALK PHOS: 78 U/L (ref 38–126)
ALT: 56 U/L (ref 17–63)
ANION GAP: 9 (ref 5–15)
AST: 59 U/L — ABNORMAL HIGH (ref 15–41)
Albumin: 2.5 g/dL — ABNORMAL LOW (ref 3.5–5.0)
BILIRUBIN TOTAL: 1.4 mg/dL — AB (ref 0.3–1.2)
BUN: 10 mg/dL (ref 6–20)
CALCIUM: 8.3 mg/dL — AB (ref 8.9–10.3)
CO2: 23 mmol/L (ref 22–32)
Chloride: 106 mmol/L (ref 101–111)
Creatinine, Ser: 1.1 mg/dL (ref 0.61–1.24)
GFR calc non Af Amer: >60 mL/min (ref >60)
Glucose, Bld: 135 mg/dL — ABNORMAL HIGH (ref 65–99)
POTASSIUM: 3.3 mmol/L — AB (ref 3.5–5.1)
SODIUM: 138 mmol/L (ref 135–145)
TOTAL PROTEIN: 5.9 g/dL — AB (ref 6.5–8.1)

## 2016-08-14 LAB — HIV ANTIBODY (ROUTINE TESTING W REFLEX): HIV Screen 4th Generation wRfx: NONREACTIVE

## 2016-08-14 LAB — RAPID URINE DRUG SCREEN, HOSP PERFORMED
AMPHETAMINES: NOT DETECTED
BENZODIAZEPINES: NOT DETECTED
Barbiturates: NOT DETECTED
Cocaine: NOT DETECTED
Opiates: POSITIVE — AB
TETRAHYDROCANNABINOL: NOT DETECTED

## 2016-08-14 LAB — CBC
HCT: 31.4 % — ABNORMAL LOW (ref 39.0–52.0)
HEMOGLOBIN: 10.3 g/dL — AB (ref 13.0–17.0)
MCH: 29.5 pg (ref 26.0–34.0)
MCHC: 32.8 g/dL (ref 30.0–36.0)
MCV: 90 fL (ref 78.0–100.0)
Platelets: 288 10*3/uL (ref 150–400)
RBC: 3.49 MIL/uL — AB (ref 4.22–5.81)
RDW: 12.7 % (ref 11.5–15.5)
WBC: 14 10*3/uL — ABNORMAL HIGH (ref 4.0–10.5)

## 2016-08-14 LAB — STREP PNEUMONIAE URINARY ANTIGEN: STREP PNEUMO URINARY ANTIGEN: NEGATIVE

## 2016-08-14 MED ORDER — ACETAMINOPHEN 325 MG PO TABS: 650.0000 mg | ORAL_TABLET | ORAL | Status: AC | PRN

## 2016-08-14 MED ORDER — ALBUTEROL SULFATE (2.5 MG/3ML) 0.083% IN NEBU
2.5000 mg | INHALATION_SOLUTION | Freq: Three times a day (TID) | RESPIRATORY_TRACT | Status: DC
  Administered 2016-08-14: 2.5 mg via RESPIRATORY_TRACT
  Filled 2016-08-14: qty 3

## 2016-08-14 MED ORDER — OXYCODONE-ACETAMINOPHEN 5-325 MG PO TABS
1.0000 | ORAL_TABLET | Freq: Once | ORAL | Status: AC
  Administered 2016-08-14: 1 via ORAL
  Filled 2016-08-14: qty 1

## 2016-08-14 MED ORDER — ALBUTEROL SULFATE (2.5 MG/3ML) 0.083% IN NEBU: 2.5000 mg | INHALATION_SOLUTION | Freq: Two times a day (BID) | RESPIRATORY_TRACT | Status: DC

## 2016-08-14 MED ORDER — HYDROCOD POLST-CPM POLST ER 10-8 MG/5ML PO SUER
5.0000 mL | Freq: Once | ORAL | Status: AC
  Administered 2016-08-14: 5 mL via ORAL
  Filled 2016-08-14: qty 5

## 2016-08-14 MED ORDER — LEVOFLOXACIN 500 MG PO TABS: 500.0000 mg | ORAL_TABLET | Freq: Every day | ORAL | 0 refills | Status: DC

## 2016-08-14 NOTE — Discharge Summary (Signed)
Physician Discharge Summary  Tylene FantasiaJohnnie Morini MVH:[id number] DOB: [birth year] DOA: 08/13/2016  PCP: Patient, No Pcp Per  Admit date: 08/13/2016 Discharge date: 08/14/2016  Time spent: 35 minutes  Recommendations for Outpatient Follow-up:  1. PCP in 1 week, neds FU CXR in 4-6weeks   Discharge Diagnoses:    Legionella pneumonia (HCC)   Leukocytosis   Tobacco abuse   SIRS (systemic inflammatory response syndrome) (HCC)  Discharge Condition: improved  Diet recommendation: regular  Filed Weights   08/13/16 0716  Weight: 109.3 kg (241 lb)    History of present illness:  Tylene FantasiaJohnnie Ferrara is a 31 y.o. male who presents with Legionella pneumonia resulting in marked respiratory distress and SIRS. The patient left [facility] A[facility] at around 02:00 the day [facility]  Hospital Course:   Legionella Pneumonia -[facility] and left AMA yesterday am and came back same day to ER to get meds and got admitted yesterday am -improved and stabilized with IVF, Levaquin, supportive care -FLu/Resp panel, blood Cx negative  -had some diarrhea this week too likely from legionella  -improved, afebrile, non toxic, WBC improved, not hypoxic and hence discharged home today with levaquin prescription to complete course and advised that he needs FU CXR in 4-6weeks   Discharge Exam: Vitals:   08/13/16 2202 08/14/16 0636  BP: 123/67 129/64  Pulse: 84 81  Resp: 18 18  Temp: 98.3 F (36.8 C) 98.5 F (36.9 C)    General: AAOx3 Cardiovascular: S1S2/RRR Respiratory: Ronchi in R lung  Discharge Instructions   Discharge Instructions    Diet general    Complete by:  As directed    Increase activity slowly    Complete by:  As directed      Current Discharge Medication List    START taking these medications   Details  acetaminophen (TYLENOL) 325 MG tablet Take 2 tablets (650 mg total) by mouth every 4 (four) hours as needed for mild pain, fever or headache.     levofloxacin (LEVAQUIN) 500 MG tablet Take 1 tablet (500 mg total) by mouth daily. For 7days Qty: 7 tablet, Refills: 0       No Known Allergies Follow-up Information    PCP. Schedule an appointment as soon as possible for a visit in 1 month(s).   Why:  You need a Follow up chest Xray in 4-6weeks           The results of significant diagnostics from this hospitalization (including imaging, microbiology, ancillary and laboratory) are listed below for reference.    Significant Diagnostic Studies: Dg Chest 2 View  Result Date: 08/13/2016 CLINICAL DATA:  Cough.  Recent pneumonia EXAM: CHEST  2 VIEW COMPARISON:  Chest CT August 10, 2016 FINDINGS: There is a extensive airspace consolidation throughout the posterior segment of the right upper lobe. There is more patchy infiltrate in a portion of the anterior and apical segments of the right upper lobe as well. Lungs elsewhere clear. Heart size and pulmonary vascular normal. No adenopathy. No bone lesions. IMPRESSION: Widespread right upper lobe pneumonia, most pronounced in the posterior segment. Lungs elsewhere clear. Cardiac silhouette within normal limits. No evident adenopathy. Electronically Signed   By: Bretta BangWilliam  Woodruff III M.D.   On: 08/13/2016 09:14   Ct Chest Wo Contrast  Result Date: 08/10/2016 CLINICAL DATA:  Cough, shortness of breath. EXAM: CT CHEST WITHOUT CONTRAST TECHNIQUE: Multidetector CT imaging of the chest was performed following the standard protocol without IV contrast. COMPARISON:  None.  FINDINGS: Cardiovascular: No significant vascular findings. Normal heart size. No pericardial effusion. Mediastinum/Nodes: No enlarged mediastinal or axillary lymph nodes. Thyroid gland, trachea, and esophagus demonstrate no significant findings. Lungs/Pleura: No pneumothorax or pleural effusion is noted. Large right upper lobe pneumonia is noted. Left lung is clear. Several small focal opacities are noted in the right middle lobe most  consistent with pneumonia as well. Upper Abdomen: No acute abnormality. Musculoskeletal: No chest wall mass or suspicious bone lesions identified. IMPRESSION: Large right upper lobe pneumonia. Small foci of inflammation are noted in the right middle lobe is well. Electronically Signed   By: Lupita Raider, M.D.   On: 08/10/2016 12:09   Ct Abdomen Pelvis W Contrast  Result Date: 08/10/2016 CLINICAL DATA:  Nausea, vomiting, decreased appetite and lower abdominal pain. EXAM: CT ABDOMEN AND PELVIS WITH CONTRAST TECHNIQUE: Multidetector CT imaging of the abdomen and pelvis was performed using the standard protocol following bolus administration of intravenous contrast. CONTRAST:  100 cc Isovue-300 COMPARISON:  None. FINDINGS: Lower chest: Patchy airspace opacity in the right middle lobe could be a developing right middle lobe pneumonia. There are small pulmonary nodules noted bilaterally. These are indeterminate. Would recommend a dedicated chest CT to evaluate rest of the lungs. No pleural effusion.  The distal esophagus is grossly normal. Hepatobiliary: No focal hepatic lesions or intrahepatic biliary dilatation. The gallbladder is normal. No common bile duct dilatation. Pancreas: No mass, inflammation or ductal dilatation. Spleen: Normal size.  No focal lesions. Adrenals/Urinary Tract: The adrenal glands and kidneys are normal. No ureteral or bladder calculi. Stomach/Bowel: The stomach, duodenum, small bowel and colon are unremarkable. No inflammatory changes, mass lesions or obstructive findings. Fibrofatty type changes involving the right colon could be related to prior inflammatory bowel disease. No active inflammation is identified. The terminal ileum is normal. The appendix is normal. Vascular/Lymphatic: The aorta and branch vessels are normal. The major venous structures are normal. Small scattered mesenteric and retroperitoneal lymph nodes but no mass or adenopathy. Reproductive: The prostate gland and  seminal vesicles are normal. Other: No pelvic mass or adenopathy. No free pelvic fluid collections. No inguinal mass or adenopathy. No abdominal wall hernia or subcutaneous lesions. Musculoskeletal: No significant bony findings. IMPRESSION: 1. Patchy right middle lobe airspace process could reflect a developing right middle lobe pneumonia. 2. Small bilateral pulmonary nodules. Recommend dedicated chest CT for further evaluation. 3. No acute abdominal/pelvic findings, mass lesions or lymphadenopathy. 4. Fibrofatty infiltrated type changes involving the right colon could be due to remote inflammatory bowel disease. Electronically Signed   By: Rudie Meyer M.D.   On: 08/10/2016 11:22   Dg Chest Port 1 View  Result Date: 08/14/2016 CLINICAL DATA:  Cough, follow-up infiltrate EXAM: PORTABLE CHEST 1 VIEW COMPARISON:  08/13/2016 FINDINGS: Significant right upper lobe infiltrate remains. The cardiac shadow is within normal limits. The left lung is clear. No bony abnormality is noted. IMPRESSION: Stable right upper lobe pneumonia. Electronically Signed   By: Alcide Clever M.D.   On: 08/14/2016 08:00    Microbiology: Recent Results (from the past 240 hour(s))  Blood Culture (routine x 2)     Status: None (Preliminary result)   Collection Time: 08/10/16 10:00 AM  Result Value Ref Range Status   Specimen Description BLOOD LEFT ANTECUBITAL  Final   Special Requests   Final    BOTTLES DRAWN AEROBIC AND ANAEROBIC Blood Culture adequate volume   Culture   Final    NO GROWTH 3 DAYS Performed at [facility]  [facility], [address], [place], [place] [address]    Report Status PENDING  Incomplete  Blood Culture (routine x 2)     Status: None (Preliminary result)   Collection Time: 08/10/16 11:24 AM  Result Value Ref Range Status   Specimen Description BLOOD RIGHT ANTECUBITAL  Final   Special Requests   Final    BOTTLES DRAWN AEROBIC AND ANAEROBIC Blood Culture adequate volume   Culture   Final    NO GROWTH 3  DAYS Performed at [facility], [address] [place], [place] [address]    Report Status PENDING  Incomplete  Culture, blood (x 2)     Status: None (Preliminary result)   Collection Time: 08/10/16  4:36 PM  Result Value Ref Range Status   Specimen Description BLOOD LEFT HAND  Final   Special Requests IN PEDIATRIC BOTTLE Blood Culture adequate volume  Final   Culture   Final    NO GROWTH 3 DAYS Performed at [facility], [address] [place], [place] [address]    Report Status PENDING  Incomplete  Respiratory Panel by PCR     Status: None   Collection Time: 08/10/16  6:02 PM  Result Value Ref Range Status   Adenovirus NOT DETECTED NOT DETECTED Final   Coronavirus 229E NOT DETECTED NOT DETECTED Final   Coronavirus HKU1 NOT DETECTED NOT DETECTED Final   Coronavirus NL63 NOT DETECTED NOT DETECTED Final   Coronavirus OC43 NOT DETECTED NOT DETECTED Final   Metapneumovirus NOT DETECTED NOT DETECTED Final   Rhinovirus / Enterovirus NOT DETECTED NOT DETECTED Final   Influenza A NOT DETECTED NOT DETECTED Final   Influenza B NOT DETECTED NOT DETECTED Final   Parainfluenza Virus 1 NOT DETECTED NOT DETECTED Final   Parainfluenza Virus 2 NOT DETECTED NOT DETECTED Final   Parainfluenza Virus 3 NOT DETECTED NOT DETECTED Final   Parainfluenza Virus 4 NOT DETECTED NOT DETECTED Final   Respiratory Syncytial Virus NOT DETECTED NOT DETECTED Final   Bordetella pertussis NOT DETECTED NOT DETECTED Final   Chlamydophila pneumoniae NOT DETECTED NOT DETECTED Final   Mycoplasma pneumoniae NOT DETECTED NOT DETECTED Final    Comment: Performed at [facility], [address], [place], [place] [address]  C difficile quick scan w PCR reflex     Status: None   Collection Time: 08/11/16 12:20 AM  Result Value Ref Range Status   C Diff antigen NEGATIVE NEGATIVE Final   C Diff toxin NEGATIVE NEGATIVE Final   C Diff interpretation No C. difficile detected.  Final  Gastrointestinal Panel  by PCR , Stool     Status: None   Collection Time: 08/11/16 12:20 AM  Result Value Ref Range Status   Campylobacter species NOT DETECTED NOT DETECTED Final   Plesimonas shigelloides NOT DETECTED NOT DETECTED Final   Salmonella species NOT DETECTED NOT DETECTED Final   Yersinia enterocolitica NOT DETECTED NOT DETECTED Final   Vibrio species NOT DETECTED NOT DETECTED Final   Vibrio cholerae NOT DETECTED NOT DETECTED Final   Enteroaggregative E coli (EAEC) NOT DETECTED NOT DETECTED Final   Enteropathogenic E coli (EPEC) NOT DETECTED NOT DETECTED Final   Enterotoxigenic E coli (ETEC) NOT DETECTED NOT DETECTED Final   Shiga like toxin producing E coli (STEC) NOT DETECTED NOT DETECTED Final   Shigella/Enteroinvasive E coli (EIEC) NOT DETECTED NOT DETECTED Final   Cryptosporidium NOT DETECTED NOT DETECTED Final   Cyclospora cayetanensis NOT DETECTED NOT DETECTED Final  Entamoeba histolytica NOT DETECTED NOT DETECTED Final   Giardia lamblia NOT DETECTED NOT DETECTED Final   Adenovirus F40/41 NOT DETECTED NOT DETECTED Final   Astrovirus NOT DETECTED NOT DETECTED Final   Norovirus GI/GII NOT DETECTED NOT DETECTED Final   Rotavirus A NOT DETECTED NOT DETECTED Final   Sapovirus (I, II, IV, and V) NOT DETECTED NOT DETECTED Final  MRSA PCR Screening     Status: None   Collection Time: 08/11/16  2:15 PM  Result Value Ref Range Status   MRSA by PCR NEGATIVE NEGATIVE Final    Comment:        The GeneXpert MRSA Assay (FDA approved for NASAL specimens only), is one component of a comprehensive MRSA colonization surveillance program. It is not intended to diagnose MRSA infection nor to guide or monitor treatment for MRSA infections.      Labs: Basic Metabolic Panel:  Recent Labs Lab 08/10/16 0957 08/11/16 0115 08/12/16 0622 08/13/16 0938 08/13/16 1420 08/14/16 0449  NA 132* 133* 135 136  --  138  K 3.2* 3.4* 3.1* 3.2*  --  3.3*  CL 101 104 100* 104  --  106  CO2 19* 21* 24 22  --   23  GLUCOSE 133* 122* 121* 104*  --  135*  BUN 9 7 6 9   --  10  CREATININE 1.20 1.09 0.96 1.10 1.13 1.10  CALCIUM 8.9 8.1* 8.5* 8.8*  --  8.3*  MG  --   --  1.7  --   --   --    Liver Function Tests:  Recent Labs Lab 08/10/16 0957 08/11/16 1213 08/12/16 0622 08/13/16 0938 08/14/16 0449  AST 39  --  29 50* 59*  ALT 47  --  37 48 56  ALKPHOS 95  --  86 92 78  BILITOT 3.1* 5.4* 5.8* 2.1* 1.4*  PROT 7.3  --  6.7 7.0 5.9*  ALBUMIN 3.8  --  3.0* 3.0* 2.5*    Recent Labs Lab 08/10/16 0957  LIPASE 20   No results for input(s): AMMONIA in the last 168 hours. CBC:  Recent Labs Lab 08/11/16 0115 08/12/16 0622 08/13/16 0938 08/13/16 1420 08/14/16 0449  WBC 15.8* 17.8* 19.3* 18.2* 14.0*  NEUTROABS  --   --  14.1*  --   --   HGB 11.5* 11.6* 11.6* 11.1* 10.3*  HCT 33.9* 32.6* 34.6* 33.7* 31.4*  MCV 89.4 85.6 88.7 89.4 90.0  PLT 168 215 282 261 288   Cardiac Enzymes: No results for input(s): CKTOTAL, CKMB, CKMBINDEX, TROPONINI in the last 168 hours. BNP: BNP (last 3 results) No results for input(s): BNP in the last 8760 hours.  ProBNP (last 3 results) No results for input(s): PROBNP in the last 8760 hours.  CBG:  Recent Labs Lab 08/11/16 0749 08/12/16 0749  GLUCAP 170* 115*       SignedZannie Cove MD.  [facility] 08/14/2016, 11:28 AM

## 2016-08-14 NOTE — Progress Notes (Signed)
Pt given discharge instructions, prescriptions, and care notes. Pt verbalized understanding AEB no further questions or concerns at this time. IV was discontinued, no redness, pain, or swelling noted at this time. Telemetry discontinued and Centralized Telemetry was notified. Pt left the floor in stable condition. 

## 2016-08-14 NOTE — Care Management Note (Signed)
Case Management Note  Patient Details  Name: Myles Ireland MRN: [id number] Date of Birth: 12/31/1985  Tylene FantasiaSubjective/Objective:                    Action/Plan:  MATCH letter given and explained to patient.  [facility] provided.  Patient voiced understanding to all of the above. Expected Discharge Date:  08/14/16               Expected Discharge Plan:  Home/Self Care  In-House Referral:     Discharge planning Services  CM Consult, MATCH Program, Medication Assistance, Indigent Health Clinic  Post Acute Care Choice:    Choice offered to:  Patient  DME Arranged:    DME Agency:     HH Arranged:    HH Agency:     Status of Service:  Completed, signed off  If discussed at [employer] of [employer] Meetings, dates discussed:    Additional Comments:  Kingsley PlanWile, Clarrisa Kaylor Marie, RN 08/14/2016, 1:05 PM

## 2016-08-14 NOTE — Progress Notes (Signed)
SATURATION QUALIFICATIONS: (This note is used to comply with regulatory documentation for home oxygen)  Patient Saturations on Room Air at Rest = 93%  Patient Saturations on Room Air while Ambulating = 96%   Please briefly explain why patient needs home oxygen: N/A

## 2016-08-15 LAB — CULTURE, BLOOD (ROUTINE X 2)
CULTURE: NO GROWTH
Culture: NO GROWTH
Culture: NO GROWTH
SPECIAL REQUESTS: ADEQUATE
Special Requests: ADEQUATE
Special Requests: ADEQUATE

## 2016-08-18 LAB — CULTURE, BLOOD (ROUTINE X 2)
CULTURE: NO GROWTH
Culture: NO GROWTH
SPECIAL REQUESTS: ADEQUATE
Special Requests: ADEQUATE

## 2017-02-27 ENCOUNTER — Emergency Department (HOSPITAL_COMMUNITY): Admission: EM | Admit: 2017-02-27 | Discharge: 2017-02-27 | Discharge status: Against Medical Advice | Payer: MEDICAID

## 2017-02-27 NOTE — ED Notes (Signed)
Unable to locate pt for triage

## 2017-02-27 NOTE — ED Notes (Signed)
Called for triage with no answer

## 2017-06-23 ENCOUNTER — Ambulatory Visit (HOSPITAL_COMMUNITY)
Admission: EM | Admit: 2017-06-23 | Discharge: 2017-06-23 | Disposition: A | Discharge status: Home / Self Care | Payer: Self-pay | Attending: Family Medicine | Admitting: Family Medicine

## 2017-06-23 ENCOUNTER — Encounter (HOSPITAL_COMMUNITY): Payer: Self-pay | Admitting: Emergency Medicine

## 2017-06-23 DIAGNOSIS — W450XXA Nail entering through skin, initial encounter: Secondary | ICD-10-CM

## 2017-06-23 DIAGNOSIS — L089 Local infection of the skin and subcutaneous tissue, unspecified: Secondary | ICD-10-CM

## 2017-06-23 DIAGNOSIS — Z23 Encounter for immunization: Secondary | ICD-10-CM

## 2017-06-23 DIAGNOSIS — S61431A Puncture wound without foreign body of right hand, initial encounter: Secondary | ICD-10-CM

## 2017-06-23 MED ORDER — CEPHALEXIN 500 MG PO CAPS: 500.0000 mg | ORAL_CAPSULE | Freq: Four times a day (QID) | ORAL | 0 refills | Status: AC

## 2017-06-23 MED ORDER — TETANUS-DIPHTH-ACELL PERTUSSIS 5-2.5-18.5 LF-MCG/0.5 IM SUSP
INTRAMUSCULAR | Status: AC
  Filled 2017-06-23: qty 0.5

## 2017-06-23 MED ORDER — TETANUS-DIPHTH-ACELL PERTUSSIS 5-2.5-18.5 LF-MCG/0.5 IM SUSP
0.5000 mL | Freq: Once | INTRAMUSCULAR | Status: AC
  Administered 2017-06-23: 0.5 mL via INTRAMUSCULAR

## 2017-06-23 MED ORDER — SULFAMETHOXAZOLE-TRIMETHOPRIM 800-160 MG PO TABS: 1.0000 | ORAL_TABLET | Freq: Two times a day (BID) | ORAL | 0 refills | Status: AC

## 2017-06-23 NOTE — Discharge Instructions (Signed)
Please begin Keflex 4 times a day for the next week.  Please also begin Bactrim twice daily for the next week as well.  Please continue to monitor the spot on your hand, please return if symptoms worsening despite antibiotic use.  Please clean with warm soapy water twice daily.

## 2017-06-23 NOTE — ED Provider Notes (Signed)
[facility]    CSN: [id number] Arrival date & time: 06/23/17  [id number]     History   Chief Complaint Chief Complaint  Patient presents with  . Hand Pain    HPI Louis Hoffman is a 32 y.o. male no protruding past medical history presenting today for evaluation of wound to his right hand.  States that approximately 1 weeks ago he hit his hand on a nail.  Since he has noticed worsening redness and drainage.  Noting green drainage from the wound.  And worsening tenderness around the area.  Denies any fevers.  Denies any numbness or tingling.  States that he thinks he had a tetanus shot while he was in prison last year, but is not 100% sure.  HPI  Past Medical History:  Diagnosis Date  . CAP (community acquired pneumonia) 08/10/2016  . Chronic lower back pain   . GERD (gastroesophageal reflux disease)   . Legionella pneumonia (HCC) 08/13/2016    Patient Active Problem List   Diagnosis Date Noted  . Pneumonia 08/13/2016  . Legionella pneumonia (HCC) 08/13/2016  . SIRS (systemic inflammatory response syndrome) (HCC) 08/13/2016  . Respiratory distress 08/13/2016  . Lobar pneumonia (HCC) 08/11/2016  . Intractable vomiting 08/11/2016  . Hyperbilirubinemia 08/11/2016  . Hyponatremia 08/11/2016  . Tobacco abuse 08/11/2016  . Cannabis abuse 08/11/2016  . Sepsis (HCC) 08/10/2016  . Leukocytosis 08/10/2016  . Hypokalemia 08/10/2016    History reviewed. No pertinent surgical history.     Home Medications    Prior to Admission medications   Medication Sig Start Date End Date Taking? Authorizing Provider  acetaminophen (TYLENOL) 325 MG tablet Take 2 tablets (650 mg total) by mouth every 4 (four) hours as needed for mild pain, fever or headache. 08/14/16   Zannie CoveJoseph, Preetha, MD  cephALEXin (KEFLEX) 500 MG capsule Take 1 capsule (500 mg total) by mouth 4 (four) times daily for 7 days. 06/23/17 06/30/17  Anatasia Tino C, PA-C  levofloxacin (LEVAQUIN) 500 MG tablet Take 1 tablet  (500 mg total) by mouth daily. For 7days 08/14/16   Zannie CoveJoseph, Preetha, MD  sulfamethoxazole-trimethoprim (BACTRIM DS,SEPTRA DS) 800-160 MG tablet Take 1 tablet by mouth 2 (two) times daily for 7 days. 06/23/17 06/30/17  Priyal Musquiz, Junius CreamerHallie C, PA-C    Family History Family History  Problem Relation Age of Onset  . Hypertension Mother   . Gout Father   . Hypertension Father     Social History Social History   Tobacco Use  . Smoking status: Current Every Day Smoker    Packs/day: 1.50    Years: 15.00    Pack years: 22.50    Types: Cigarettes  . Smokeless tobacco: Never Used  Substance Use Topics  . Alcohol use: No  . Drug use: Yes    Types: Marijuana    Comment: 08/13/2016 "4-6 times/day"     Allergies   Patient has no known allergies.   Review of Systems Review of Systems  Constitutional: Negative for fatigue and fever.  Respiratory: Negative for cough.   Cardiovascular: Negative for chest pain.  Gastrointestinal: Negative for abdominal pain, nausea and vomiting.  Musculoskeletal: Negative for arthralgias, back pain and myalgias.  Skin: Positive for color change and wound.  Neurological: Negative for dizziness, weakness, light-headedness, numbness and headaches.     Physical Exam Triage Vital Signs ED Triage Vitals [06/23/17 1020]  Enc Vitals Group     BP (!) 135/98     Pulse Rate 85     Resp 18  Temp 98 F (36.7 C)     Temp Source Oral     SpO2 100 %     Weight      Height      Head Circumference      Peak Flow      Pain Score      Pain Loc      Pain Edu?      Excl. in GC?    No data found.  Updated Vital Signs BP (!) 135/98 (BP Location: Right Arm)   Pulse 85   Temp 98 F (36.7 C) (Oral)   Resp 18   SpO2 100%   Visual Acuity Right Eye Distance:   Left Eye Distance:   Bilateral Distance:    Right Eye Near:   Left Eye Near:    Bilateral Near:     Physical Exam  Constitutional: He appears well-developed and well-nourished.  HENT:  Head:  Normocephalic and atraumatic.  Eyes: Conjunctivae are normal.  Neck: Neck supple.  Cardiovascular: Normal rate.  Pulmonary/Chest: Effort normal. No respiratory distress.  Musculoskeletal: He exhibits no edema.  Full active range of motion of wrist and fingers, radial pulse 2+  Neurological: He is alert.  Skin: Skin is warm and dry.  Right hand medial aspect of fifth MCP : 0.5 x 0.5 cm lesion with central ulceration, surrounding 1.5 x 1.5 cm of white surrounding skin as well as erythema.  Tenderness to palpation.  Psychiatric: He has a normal mood and affect.  Nursing note and vitals reviewed.    UC Treatments / Results  Labs (all labs ordered are listed, but only abnormal results are displayed) Labs Reviewed - No data to display  EKG None Radiology No results found.  Procedures Procedures (including critical care time)  Medications Ordered in UC Medications  Tdap (BOOSTRIX) injection 0.5 mL (has no administration in time range)     Initial Impression / Assessment and Plan / UC Course  I have reviewed the triage vital signs and the nursing notes.  Pertinent labs & imaging results that were available during my care of the patient were reviewed by me and considered in my medical decision making (see chart for details).     Patient with fluid concerning for infection.  Will provide Keflex and Bactrim to cover for strep and staph.  Will update patient on his Tdap as he is not completely sure about when this was. Discussed strict return precautions. Patient verbalized understanding and is agreeable with plan.   Final Clinical Impressions(s) / UC Diagnoses   Final diagnoses:  Puncture wound of right hand with infection, initial encounter    ED Discharge Orders        Ordered    cephALEXin (KEFLEX) 500 MG capsule  4 times daily     06/23/17 1027    sulfamethoxazole-trimethoprim (BACTRIM DS,SEPTRA DS) 800-160 MG tablet  2 times daily     06/23/17 1027        Controlled Substance Prescriptions [place] Controlled Substance Registry consulted? Not Applicable   Lew Dawes, [place] 06/23/17 1036

## 2017-06-23 NOTE — ED Triage Notes (Signed)
Pt here for right hand infection after hitting hand on nail

## 2017-07-21 ENCOUNTER — Ambulatory Visit (HOSPITAL_COMMUNITY)
Admission: EM | Admit: 2017-07-21 | Discharge: 2017-07-21 | Disposition: A | Discharge status: Home / Self Care | Payer: Self-pay | Attending: Family Medicine | Admitting: Family Medicine

## 2017-07-21 ENCOUNTER — Encounter (HOSPITAL_COMMUNITY): Payer: Self-pay | Admitting: Emergency Medicine

## 2017-07-21 DIAGNOSIS — J029 Acute pharyngitis, unspecified: Secondary | ICD-10-CM

## 2017-07-21 DIAGNOSIS — J020 Streptococcal pharyngitis: Secondary | ICD-10-CM

## 2017-07-21 DIAGNOSIS — R030 Elevated blood-pressure reading, without diagnosis of hypertension: Secondary | ICD-10-CM

## 2017-07-21 LAB — POCT RAPID STREP A: STREPTOCOCCUS, GROUP A SCREEN (DIRECT): POSITIVE — AB

## 2017-07-21 MED ORDER — AMOXICILLIN 500 MG PO CAPS: 500.0000 mg | ORAL_CAPSULE | Freq: Two times a day (BID) | ORAL | 0 refills | Status: AC

## 2017-07-21 NOTE — ED Triage Notes (Signed)
Pt sts sore throat and has had people in home with strep

## 2017-07-21 NOTE — Discharge Instructions (Addendum)
Strep was positive Drink plenty of water and rest Prescribed amoxicillin  twice daily for 10 days.  Take as directed and to completion.  Perform salt water gargles at home, drink cold or warm liquids, etc... Follow up with PCP if symptoms persist Go to the ER if you have any new or worsening symptoms (difficulty tolerating liquids, drooling, difficulty breathing, fever, severe pain, change in voice, etc...)  Blood pressure elevated in office.  Please recheck in 24 hours.  If it continues to be greater than 140/90 please follow up with PCP for further evaluation and management.

## 2017-07-21 NOTE — ED Provider Notes (Signed)
[facility]   [id number] 07/21/17 Arrival Time: 1000  SUBJECTIVE: History from: patient.  Louis Hoffman is a 32 y.o. male who presents with abrupt onset of sore throat for 3 days .  Admits to positive strep exposure at home.  Has not tried OTC medications.  Symptoms are made worse with swallowing.  Denies previous symptoms in the past.   Complains of associated chills, and nausea.  Denies fever, sinus pain, rhinorrhea, drooling, difficulty breathing, chest pain, changes in bowel or bladder habits.    ROS: As per HPI.  Past Medical History:  Diagnosis Date  . CAP (community acquired pneumonia) 08/10/2016  . Chronic lower back pain   . GERD (gastroesophageal reflux disease)   . Legionella pneumonia (HCC) 08/13/2016   History reviewed. No pertinent surgical history. No Known Allergies No current facility-administered medications on file prior to encounter.    Current Outpatient Medications on File Prior to Encounter  Medication Sig Dispense Refill  . acetaminophen (TYLENOL) 325 MG tablet Take 2 tablets (650 mg total) by mouth every 4 (four) hours as needed for mild pain, fever or headache.    . levofloxacin (LEVAQUIN) 500 MG tablet Take 1 tablet (500 mg total) by mouth daily. For 7days 7 tablet 0   Social History   Socioeconomic History  . Marital status: Single    Spouse name: Not on file  . Number of children: Not on file  . Years of education: Not on file  . Highest education level: Not on file  Occupational History  . Not on file  Social Needs  . Financial resource strain: Not on file  . Food insecurity:    Worry: Not on file    Inability: Not on file  . Transportation needs:    Medical: Not on file    Non-medical: Not on file  Tobacco Use  . Smoking status: Current Every Day Smoker    Packs/day: 1.50    Years: 15.00    Pack years: 22.50    Types: Cigarettes  . Smokeless tobacco: Never Used  Substance and Sexual Activity  . Alcohol use: No  . Drug  use: Yes    Types: Marijuana    Comment: 08/13/2016 "4-6 times/day"  . Sexual activity: Yes  Lifestyle  . Physical activity:    Days per week: Not on file    Minutes per session: Not on file  . Stress: Not on file  Relationships  . Social connections:    Talks on phone: Not on file    Gets together: Not on file    Attends religious service: Not on file    Active member of club or organization: Not on file    Attends meetings of clubs or organizations: Not on file    Relationship status: Not on file  . Intimate partner violence:    Fear of current or ex partner: Not on file    Emotionally abused: Not on file    Physically abused: Not on file    Forced sexual activity: Not on file  Other Topics Concern  . Not on file  Social History Narrative  . Not on file   Family History  Problem Relation Age of Onset  . Hypertension Mother   . Gout Father   . Hypertension Father     OBJECTIVE:  Vitals:   07/21/17 1016  BP: (!) 148/99  Pulse: 78  Resp: 18  Temp: 99.8 F (37.7 C)  TempSrc: Oral  SpO2: 96%  General appearance: alert; appears fatigued HEENT: Ears: EACs clear, TMs pearly gray with visible cone of light, without erythema; Eyes: PERRL, EOMI grossly; Sinuses nontender to palpation; Nose: clear rhinorrhea; Throat: tonsils 3+ erythematous with white exudates bilaterally; unable to visualize uvula, but tonsils appear symmetrical Neck: Cervical LAD Lungs: CTA bilaterally without adventitious breath sounds Heart: regular rate and rhythm.  Radial pulses 2+ symmetrical bilaterally Skin: warm and dry Psychological: alert and cooperative; normal mood and affect  Strep was positive   ASSESSMENT & PLAN:  1. Strep pharyngitis   2. Elevated blood pressure reading     Meds ordered this encounter  Medications  . amoxicillin (AMOXIL) 500 MG capsule    Sig: Take 1 capsule (500 mg total) by mouth 2 (two) times daily for 10 days.    Dispense:  20 capsule    Refill:  0     Order Specific Question:   Supervising Provider    Answer:   Isa Rankin [[id number]]    Strep was positive Drink plenty of water and rest Prescribed amoxicillin  twice daily for 10 days.  Take as directed and to completion.  Perform salt water gargles at home, drink cold or warm liquids, etc... Follow up with PCP if symptoms persist Go to the ER if you have any new or worsening symptoms (difficulty tolerating liquids, drooling, difficulty breathing, fever, severe pain, change in voice, etc...)  Blood pressure elevated in office.  Please recheck in 24 hours.  If it continues to be greater than 140/90 please follow up with PCP for further evaluation and management.   Reviewed expectations re: course of current medical issues. Questions answered. Outlined signs and symptoms indicating need for more acute intervention. Patient verbalized understanding. After Visit Summary given.        Rennis Harding, PA-C 07/21/17 1052

## 2018-05-24 IMAGING — CT CT CHEST W/O CM
2 of 4 series · 15 of 36 positions shown, 18 images · non-contrast
Comparison: None.

CLINICAL DATA: Cough, shortness of breath.

EXAM:
CT CHEST WITHOUT CONTRAST
TECHNIQUE: Multidetector CT imaging of the chest was performed following the
standard protocol without IV contrast.

[Series 2: chest w/o st · axial · non-contrast · 0.79mm/px · z∈[-360,-84]mm · 12 of 164 slices shown, 15 images]
[im 13/164  mediastinal]
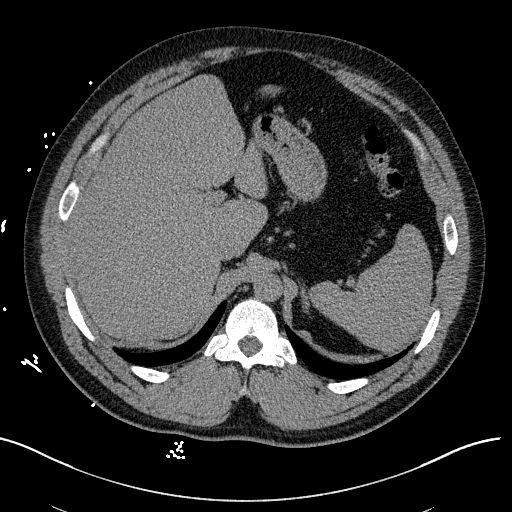
[im 13/164  lung]
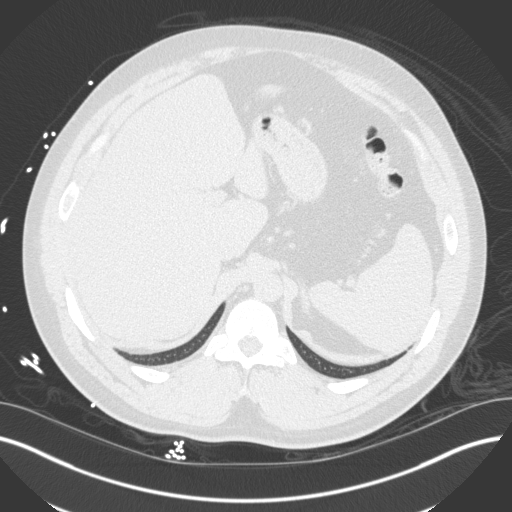
[im 26/164  lung]
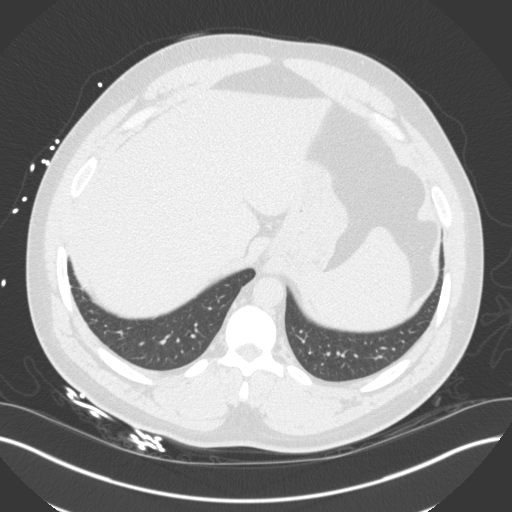
[im 38/164  lung]
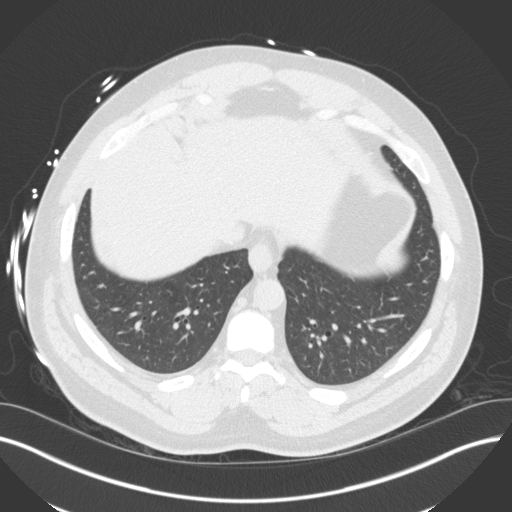
[im 51/164  lung]
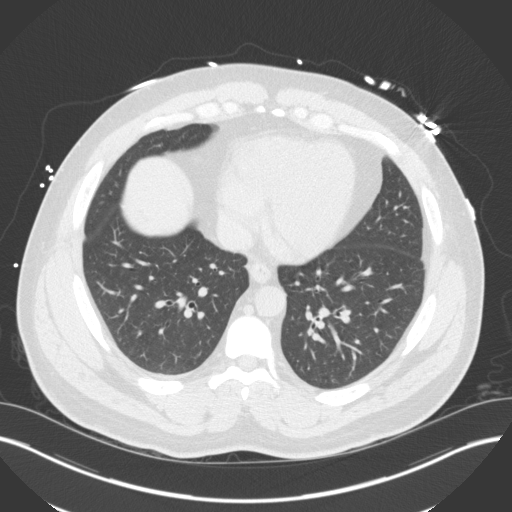
[im 63/164  mediastinal]
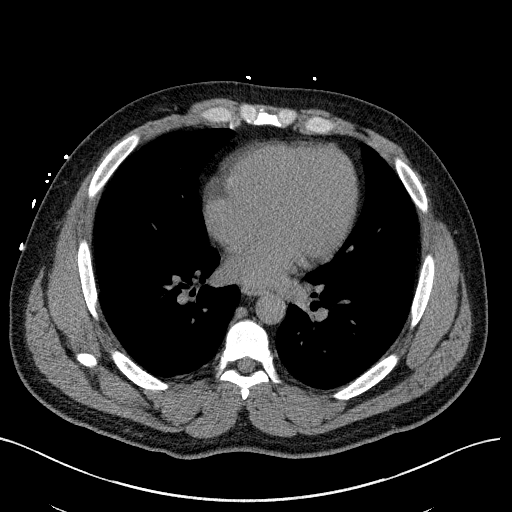
[im 63/164  lung]
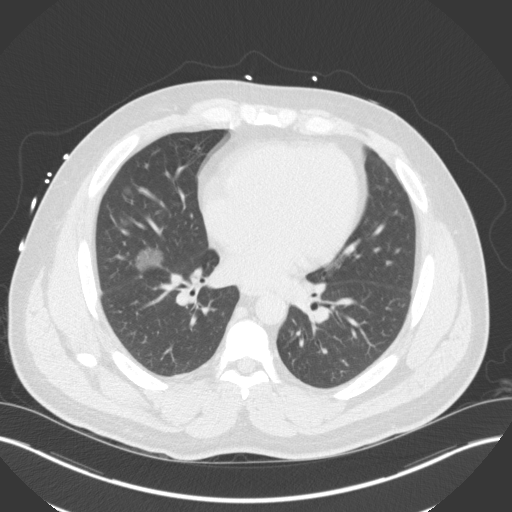
[im 76/164  lung]
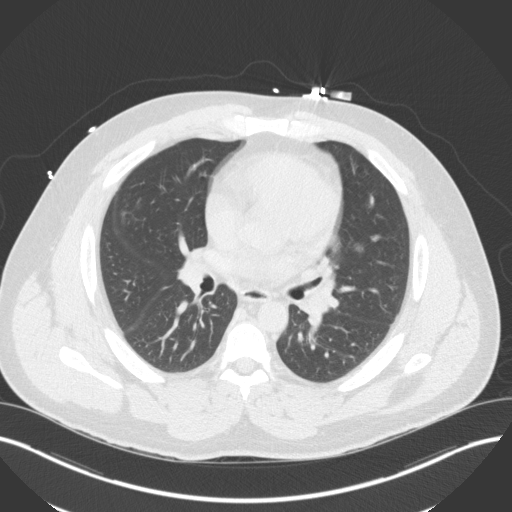
[im 88/164  lung]
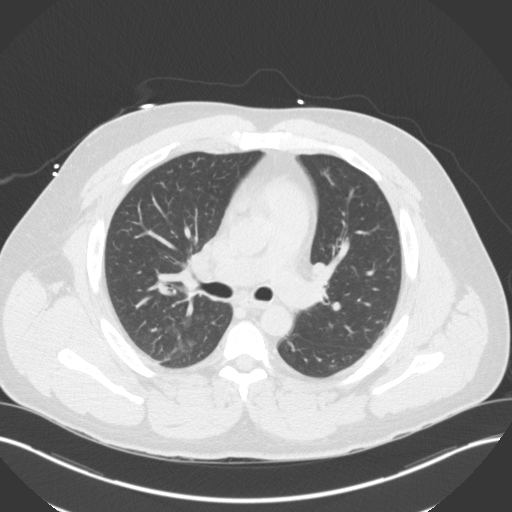
[im 101/164  lung]
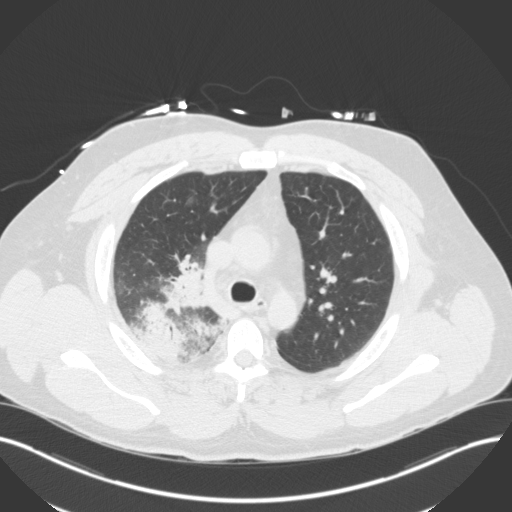
[im 113/164  mediastinal]
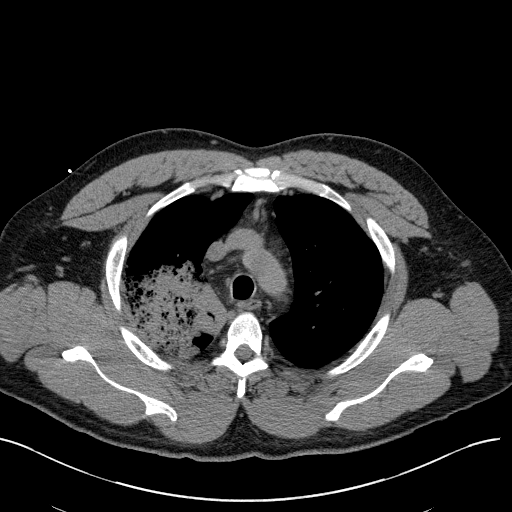
[im 113/164  lung]
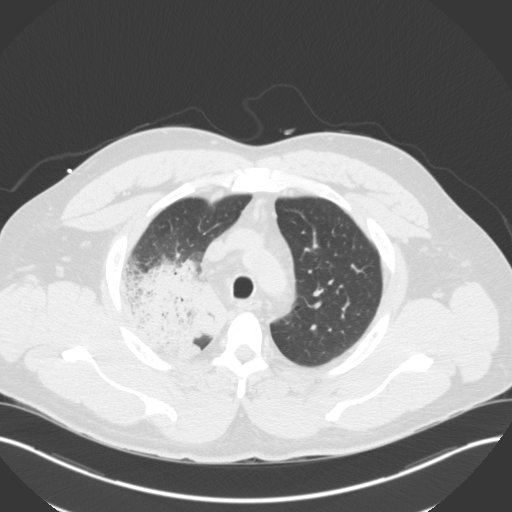
[im 126/164  lung]
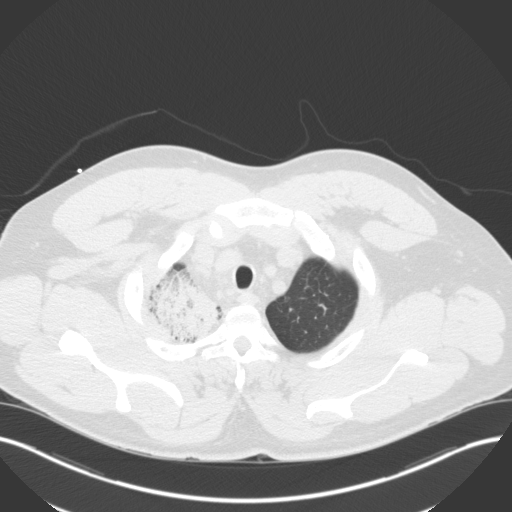
[im 138/164  lung]
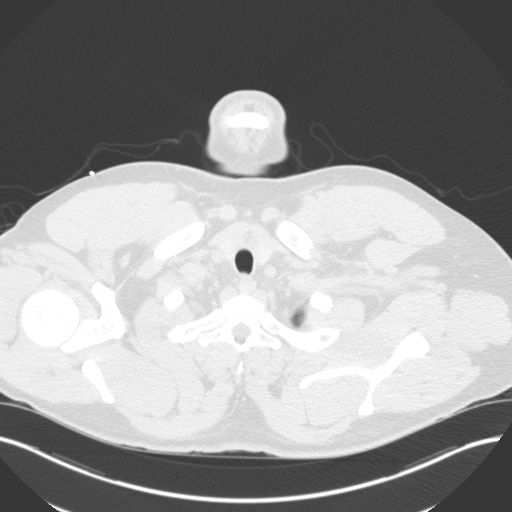
[im 151/164  lung]
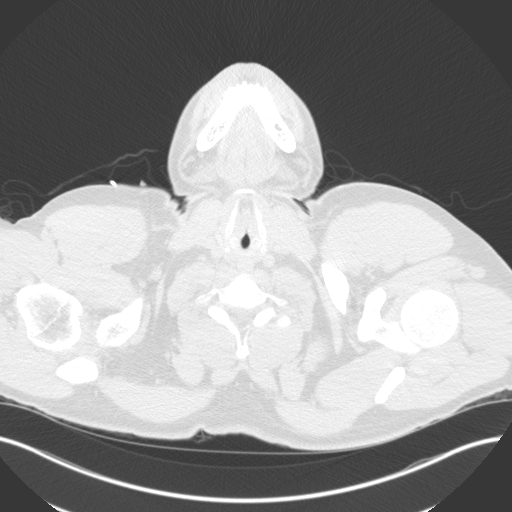

[Series 3: coronals · coronal · 0.64mm/px · 3 of 163 slices shown]
[im 33/163  lung]
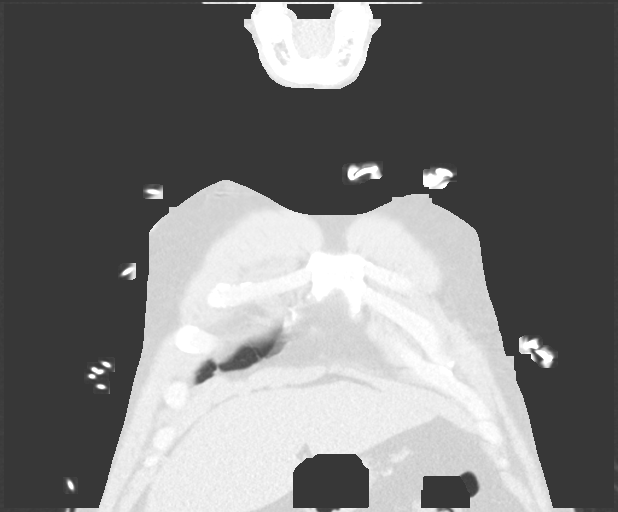
[im 65/163  lung]
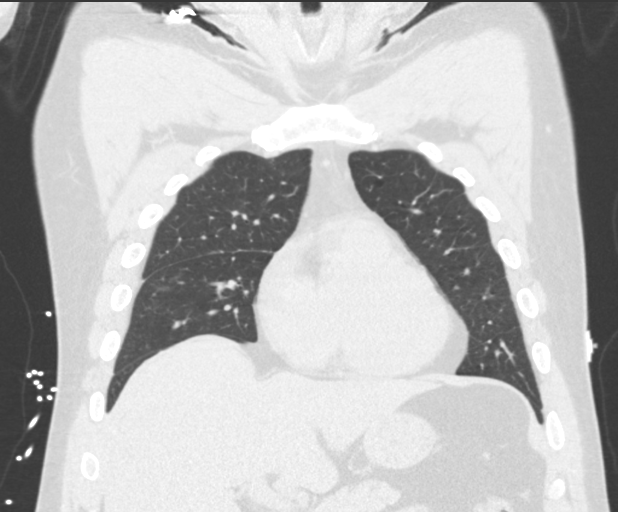
[im 98/163  lung]
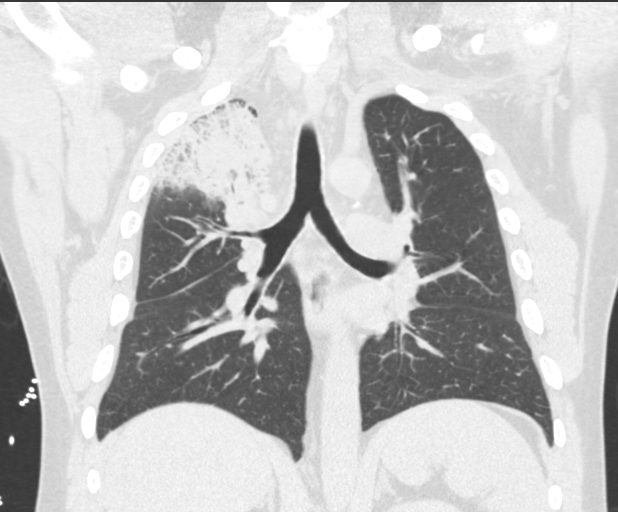

[15 of 36 positions shown; findings below may reference images not displayed]

FINDINGS: Cardiovascular: No significant vascular findings. Normal heart size.
No pericardial effusion.

Mediastinum/Nodes: No enlarged mediastinal or axillary lymph nodes.
Thyroid gland, trachea, and esophagus demonstrate no significant
findings.

Lungs/Pleura: No pneumothorax or pleural effusion is noted. Large
right upper lobe pneumonia is noted. Left lung is clear. Several
small focal opacities are noted in the right middle lobe most
consistent with pneumonia as well.

Upper Abdomen: No acute abnormality.

Musculoskeletal: No chest wall mass or suspicious bone lesions
identified.
IMPRESSION: Large right upper lobe pneumonia. Small foci of inflammation are
noted in the right middle lobe is well.

## 2018-05-27 IMAGING — DX DG CHEST 2V
2 series · 2 of 2 positions shown · non-contrast
Comparison: Chest CT August 10, 2016

CLINICAL DATA: Cough.  Recent pneumonia

EXAM:
CHEST  2 VIEW

[chest pa]
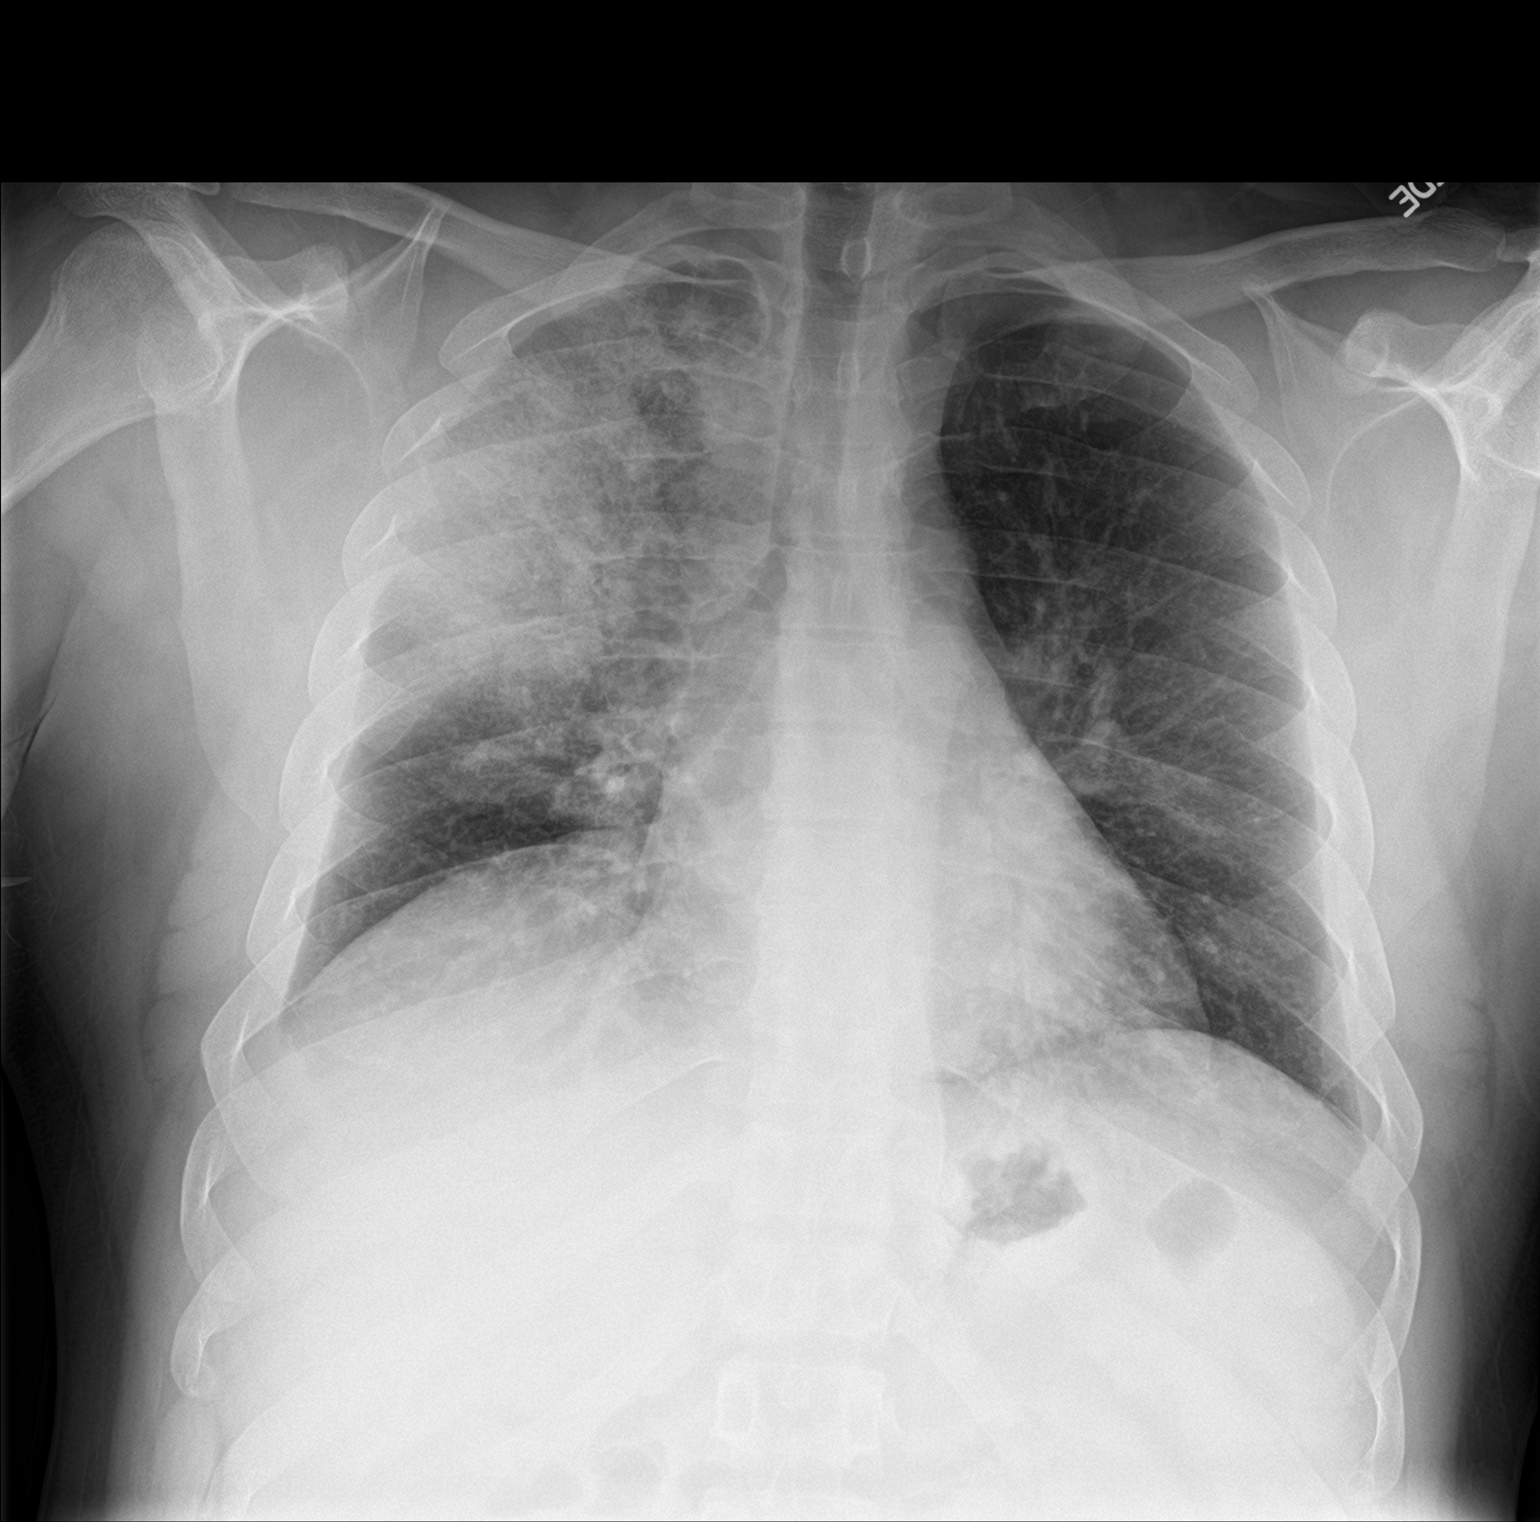

[chest lat]
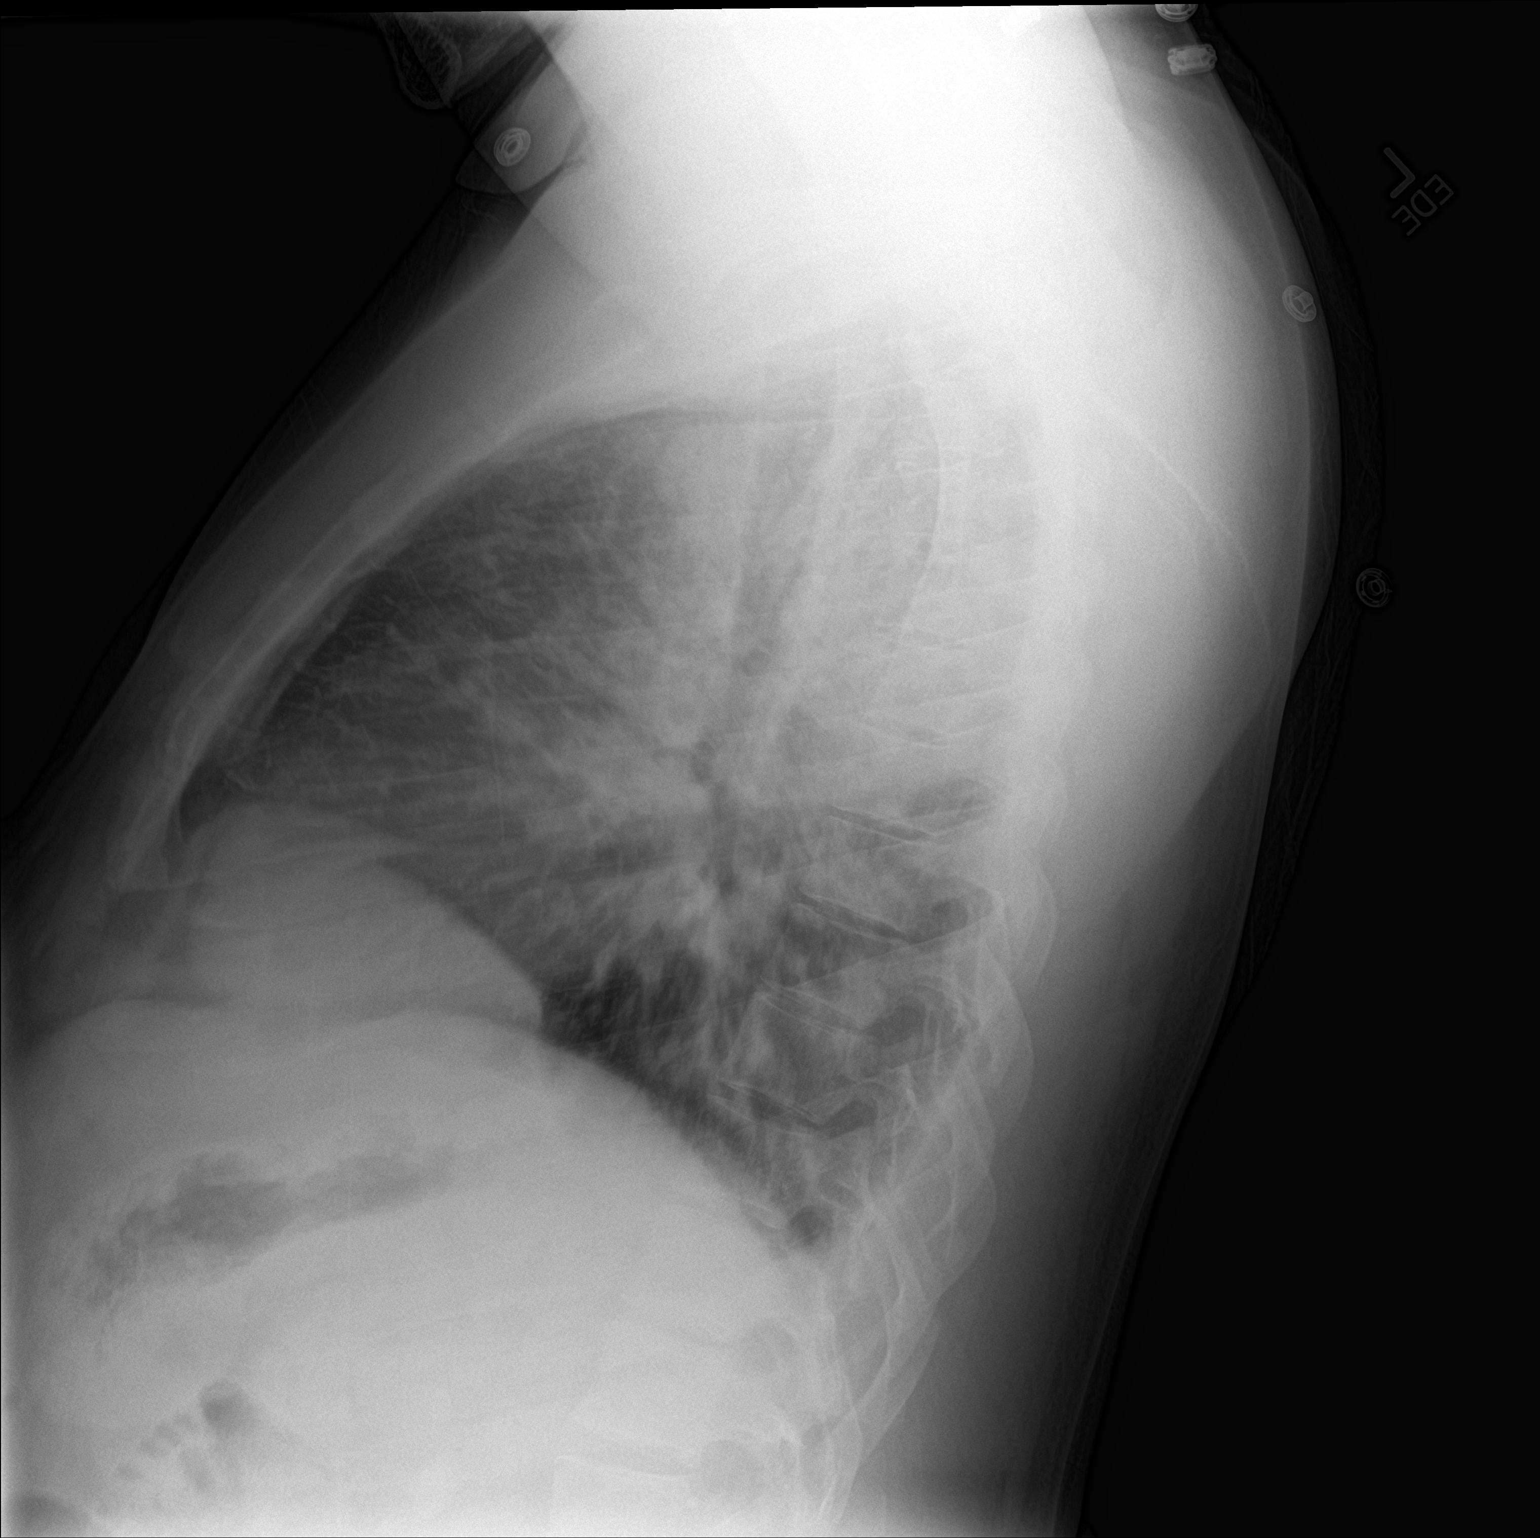

[2 of 2 positions shown; findings below may reference images not displayed]

FINDINGS: There is a extensive airspace consolidation throughout the posterior
segment of the right upper lobe. There is more patchy infiltrate in
a portion of the anterior and apical segments of the right upper
lobe as well. Lungs elsewhere clear. Heart size and pulmonary
vascular normal. No adenopathy. No bone lesions.
IMPRESSION: Widespread right upper lobe pneumonia, most pronounced in the
posterior segment. Lungs elsewhere clear. Cardiac silhouette within
normal limits. No evident adenopathy.

## 2018-05-28 IMAGING — DX DG CHEST 1V PORT
1 series · 1 of 1 positions shown · non-contrast
Comparison: 08/13/2016

CLINICAL DATA: Cough, follow-up infiltrate

EXAM:
PORTABLE CHEST 1 VIEW

[chest ap]
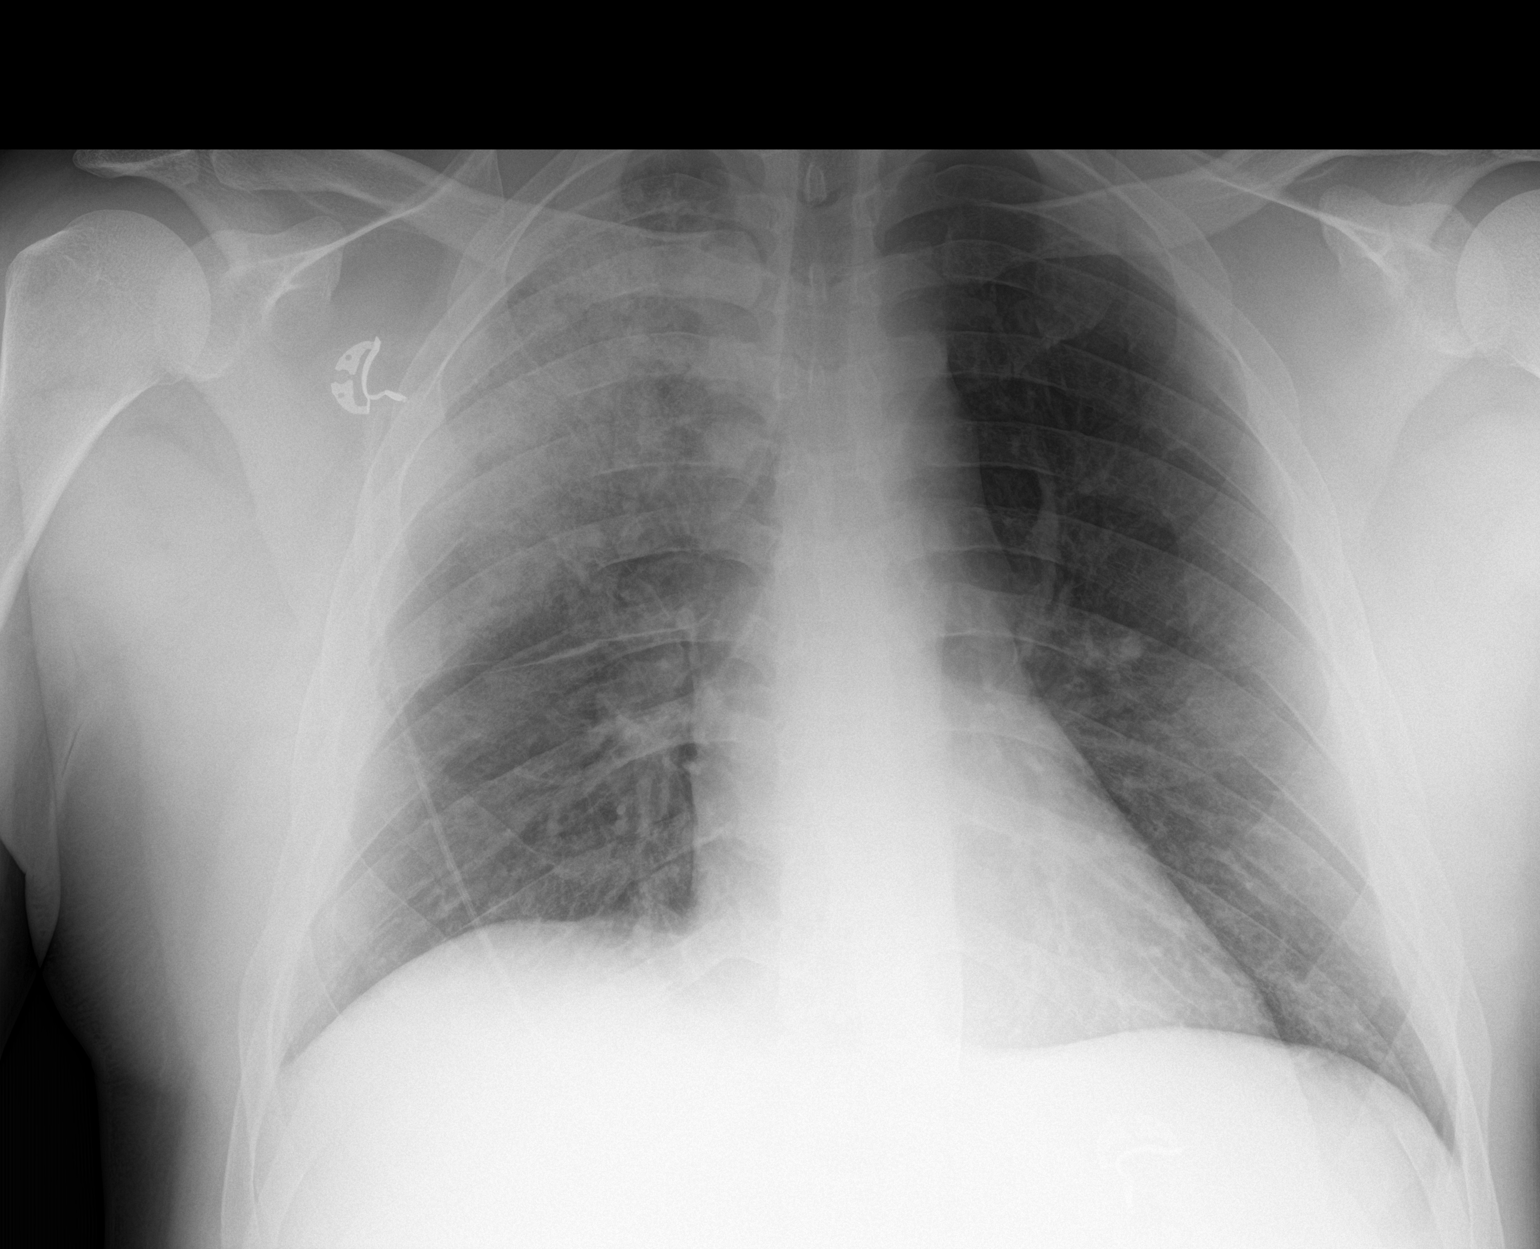

[1 of 1 positions shown; findings below may reference images not displayed]

FINDINGS: Significant right upper lobe infiltrate remains. The cardiac shadow
is within normal limits. The left lung is clear. No bony abnormality
is noted.
IMPRESSION: Stable right upper lobe pneumonia.

## 2018-06-24 ENCOUNTER — Ambulatory Visit (HOSPITAL_COMMUNITY)
Admission: EM | Admit: 2018-06-24 | Discharge: 2018-06-24 | Disposition: A | Discharge status: Home / Self Care | Payer: Self-pay | Attending: Emergency Medicine | Admitting: Emergency Medicine

## 2018-06-24 ENCOUNTER — Other Ambulatory Visit: Payer: Self-pay

## 2018-06-24 ENCOUNTER — Encounter (HOSPITAL_COMMUNITY): Payer: Self-pay | Admitting: Emergency Medicine

## 2018-06-24 DIAGNOSIS — R21 Rash and other nonspecific skin eruption: Secondary | ICD-10-CM

## 2018-06-24 MED ORDER — VALACYCLOVIR HCL 500 MG PO TABS: 500.0000 mg | ORAL_TABLET | Freq: Two times a day (BID) | ORAL | 0 refills | Status: AC

## 2018-06-24 MED ORDER — MUPIROCIN CALCIUM 2 % EX CREA
1.0000 | TOPICAL_CREAM | Freq: Two times a day (BID) | CUTANEOUS | 0 refills | Status: DC
Start: 2018-06-24 — End: 2023-10-01

## 2018-06-24 NOTE — ED Triage Notes (Signed)
Pt presents to [facility] for assessment of rash to left side of face/cheek.  Patient states it came up two days ago.  C/o pain with pressure (like from his pillow).

## 2018-06-24 NOTE — ED Notes (Signed)
Patient able to ambulate independently  

## 2018-06-24 NOTE — Discharge Instructions (Signed)
We will treat for herpes as source of lesions, complete Valtrex as prescribed.  To ensure this is not bacterial in nature you may use prescribed cream, thin amount, twice a day.  If no improvement in the next week or if worsening please return to be seen.

## 2018-06-24 NOTE — ED Provider Notes (Signed)
[facility]    CSN: [id number] Arrival date & time: 06/24/18  1041     History   Chief Complaint Chief Complaint  Patient presents with  . Rash    HPI Louis Hoffman is a 33 y.o. male.   Louis Hoffman presents with complaints of rash to left face as well as lesion to penis, which he noticed approximately 2 days ago. Face lesion has increased in size. It is not necessarily painful but feels "irritating. If he lays on his face it is uncomfortable. Some itching and burning sensation to both lesions. No drainage from the lesions. No penile discharge. No scrotal pain. No other rash elsewhere. States he has had similar in the past to penis and was prescribed antiviral for likely herpes. Denies having it on his face before. Endorses using condoms with sexual activity. Denies  Urinary symptoms. No vision change, no headache, no dizziness. He tried applying "ringworm cream" to his face rash which did not seem to help. No other known exposures. States he has had shiingles in the past.     ROS per HPI, negative if not otherwise mentioned.      Past Medical History:  Diagnosis Date  . CAP (community acquired pneumonia) 08/10/2016  . Chronic lower back pain   . GERD (gastroesophageal reflux disease)   . Legionella pneumonia (HCC) 08/13/2016    Patient Active Problem List   Diagnosis Date Noted  . Pneumonia 08/13/2016  . Legionella pneumonia (HCC) 08/13/2016  . SIRS (systemic inflammatory response syndrome) (HCC) 08/13/2016  . Respiratory distress 08/13/2016  . Lobar pneumonia (HCC) 08/11/2016  . Intractable vomiting 08/11/2016  . Hyperbilirubinemia 08/11/2016  . Hyponatremia 08/11/2016  . Tobacco abuse 08/11/2016  . Cannabis abuse 08/11/2016  . Sepsis (HCC) 08/10/2016  . Leukocytosis 08/10/2016  . Hypokalemia 08/10/2016    History reviewed. No pertinent surgical history.     Home Medications    Prior to Admission medications   Medication Sig Start Date End  Date Taking? Authorizing Provider  acetaminophen (TYLENOL) 325 MG tablet Take 2 tablets (650 mg total) by mouth every 4 (four) hours as needed for mild pain, fever or headache. 08/14/16   Zannie CoveJoseph, Preetha, MD  levofloxacin (LEVAQUIN) 500 MG tablet Take 1 tablet (500 mg total) by mouth daily. For 7days 08/14/16   Zannie CoveJoseph, Preetha, MD  mupirocin cream (BACTROBAN) 2 % Apply 1 application topically 2 (two) times daily. 06/24/18   Georgetta HaberBurky, Veronia Laprise B, NP  valACYclovir (VALTREX) 500 MG tablet Take 1 tablet (500 mg total) by mouth 2 (two) times daily for 5 days. 06/24/18 06/29/18  Georgetta HaberBurky, Bronislaus Verdell B, NP    Family History Family History  Problem Relation Age of Onset  . Hypertension Mother   . Gout Father   . Hypertension Father     Social History Social History   Tobacco Use  . Smoking status: Current Every Day Smoker    Packs/day: 1.50    Years: 15.00    Pack years: 22.50    Types: Cigarettes  . Smokeless tobacco: Never Used  Substance Use Topics  . Alcohol use: No  . Drug use: Yes    Types: Marijuana    Comment: 08/13/2016 "4-6 times/day"     Allergies   Patient has no known allergies.   Review of Systems Review of Systems   Physical Exam Triage Vital Signs ED Triage Vitals [06/24/18 1052]  Enc Vitals Group     BP (!) 146/100     Pulse Rate 87  Resp 20     Temp 97.9 F (36.6 C)     Temp Source Oral     SpO2 95 %     Weight      Height      Head Circumference      Peak Flow      Pain Score 0     Pain Loc      Pain Edu?      Excl. in GC?    No data found.  Updated Vital Signs BP (!) 146/100 (BP Location: Left Arm)   Pulse 87   Temp 97.9 F (36.6 C) (Oral)   Resp 20   SpO2 95%    Physical Exam Exam conducted with a chaperone present.  Constitutional:      Appearance: He is well-developed.  HENT:     Head:      Comments: Circular cluster of red papular/ vesicular lesions to left cheek, with appearance of clear fluid; minimal tenderness, no induration; no  other lesions to skin; no active drainage; see photo  Cardiovascular:     Rate and Rhythm: Normal rate and regular rhythm.  Pulmonary:     Effort: Pulmonary effort is normal.     Breath sounds: Normal breath sounds.  Genitourinary:    Penis: Lesions present.      Scrotum/Testes: Normal.     Comments: Two papules to proximal penis shaft with apparent clear/white fluid contained, no active drainage, minimal tenderness; no induration or any other surrounding area rash or redness  Skin:    General: Skin is warm and dry.  Neurological:     Mental Status: He is alert and oriented to person, place, and time.        UC Treatments / Results  Labs (all labs ordered are listed, but only abnormal results are displayed) Labs Reviewed - No data to display  EKG None  Radiology No results found.  Procedures Procedures (including critical care time)  Medications Ordered in UC Medications - No data to display  Initial Impression / Assessment and Plan / UC Course  I have reviewed the triage vital signs and the nursing notes.  Pertinent labs & imaging results that were available during my care of the patient were reviewed by me and considered in my medical decision making (see chart for details).     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 Question impetigo as well, bactroban provided as well as valtrex. Patient verbalized understanding and agreeable to plan.   Final Clinical Impressions(s) / UC Diagnoses   Final diagnoses:  Rash     Discharge Instructions     We will treat for herpes as source of lesions, complete Valtrex as prescribed.  To ensure this is not bacterial in nature you may use prescribed cream, thin amount, twice a day.  If no improvement in the next week or if worsening please return to be seen.     ED Prescriptions    Medication Sig Dispense Auth. Provider   valACYclovir  (VALTREX) 500 MG tablet Take 1 tablet (500 mg total) by mouth 2 (two) times daily for 5 days. 10 tablet Linus Mako B, NP   mupirocin cream (BACTROBAN) 2 % Apply 1 application topically 2 (two) times daily. 15 g Georgetta Haber, NP     Controlled Substance Prescriptions [place] Controlled Substance Registry consulted? Not Applicable   Georgetta Haber, NP 06/24/18 1123

## 2018-10-01 ENCOUNTER — Ambulatory Visit (HOSPITAL_COMMUNITY)
Admission: EM | Admit: 2018-10-01 | Discharge: 2018-10-01 | Disposition: A | Discharge status: Home / Self Care | Payer: Self-pay | Attending: Family Medicine | Admitting: Family Medicine

## 2018-10-01 ENCOUNTER — Other Ambulatory Visit: Payer: Self-pay

## 2018-10-01 ENCOUNTER — Encounter (HOSPITAL_COMMUNITY): Payer: Self-pay | Admitting: *Deleted

## 2018-10-01 DIAGNOSIS — L259 Unspecified contact dermatitis, unspecified cause: Secondary | ICD-10-CM

## 2018-10-01 MED ORDER — PREDNISONE 10 MG (21) PO TBPK: ORAL_TABLET | Freq: Every day | ORAL | 0 refills | Status: DC

## 2018-10-01 NOTE — ED Provider Notes (Signed)
[facility]   [id number] 10/01/18 Arrival Time: 1603  ASSESSMENT & PLAN:  1. Contact dermatitis, unspecified contact dermatitis type, unspecified trigger    Begin: Meds ordered this encounter  Medications  . predniSONE (STERAPRED UNI-PAK 21 TAB) 10 MG (21) TBPK tablet    Sig: Take by mouth daily. Take as directed.    Dispense:  21 tablet    Refill:  0    Will follow up with PCP or here if worsening or failing to improve as anticipated. Reviewed expectations re: course of current medical issues. Questions answered. Outlined signs and symptoms indicating need for more acute intervention. Patient verbalized understanding. After Visit Summary given.   SUBJECTIVE:  Louis Hoffman is a 33 y.o. male who presents with a skin complaint.   Location: "various places" Onset: gradual Duration: off and on over past 1-2 weeks Associated pruritis? Yes when present. Associated pain? none Progression: fluctuating a lot  Drainage? No  Known trigger? No  New soaps/lotions/topicals/detergents/environmental exposures? No Contacts with similar? No Recent travel? No  Other associated symptoms: none Therapies tried thus far: OTC ointment Arthralgia or myalgia? none Recent illness? none Fever? none New medications? none No specific aggravating or alleviating factors reported.  ROS: As per HPI. All other systems negative.   OBJECTIVE: Vitals:   10/01/18 1629  BP: 122/70  Pulse: 95  Resp: 16  Temp: 98.6 F (37 C)  TempSrc: Oral  SpO2: 97%    General appearance: alert; no distress Lungs: clear to auscultation bilaterally Heart: regular rate and rhythm Extremities: no edema Skin: warm and dry; signs of infection: no; various patches of irritated and mildly erythematous skin over trunk; various sizes; some confluence; no bleeding or oozing Psychological: alert and cooperative; normal mood and affect  Allergies  Allergen Reactions  . Other     Dairy products     Past Medical History:  Diagnosis Date  . CAP (community acquired pneumonia) 08/10/2016  . Chronic lower back pain   . GERD (gastroesophageal reflux disease)   . Legionella pneumonia (HCC) 08/13/2016   Social History   Socioeconomic History  . Marital status: Single    Spouse name: Not on file  . Number of children: Not on file  . Years of education: Not on file  . Highest education level: Not on file  Occupational History  . Not on file  Social Needs  . Financial resource strain: Not on file  . Food insecurity    Worry: Not on file    Inability: Not on file  . Transportation needs    Medical: Not on file    Non-medical: Not on file  Tobacco Use  . Smoking status: Current Every Day Smoker    Packs/day: 1.50    Years: 15.00    Pack years: 22.50    Types: Cigarettes  . Smokeless tobacco: Never Used  Substance and Sexual Activity  . Alcohol use: No  . Drug use: Yes    Types: Marijuana    Comment: 08/13/2016 "4-6 times/day"  . Sexual activity: Not on file  Lifestyle  . Physical activity    Days per week: Not on file    Minutes per session: Not on file  . Stress: Not on file  Relationships  . Social Musicianconnections    Talks on phone: Not on file    Gets together: Not on file    Attends religious service: Not on file    Active member of club or organization: Not on file  Attends meetings of clubs or organizations: Not on file    Relationship status: Not on file  . Intimate partner violence    Fear of current or ex partner: Not on file    Emotionally abused: Not on file    Physically abused: Not on file    Forced sexual activity: Not on file  Other Topics Concern  . Not on file  Social History Narrative  . Not on file   Family History  Problem Relation Age of Onset  . Hypertension Mother   . Gout Father   . Hypertension Father    History reviewed. No pertinent surgical history.   Vanessa Kick, MD 10/11/18 [phone]

## 2018-10-01 NOTE — ED Triage Notes (Signed)
C/o pruritic sporadic rash to BUE & BLE x 1-2 wks.  Has been applying Mupiricin oint (has had same rash in past), but feels oint is making rash worse.  Denies fevers.

## 2019-07-27 ENCOUNTER — Ambulatory Visit (HOSPITAL_COMMUNITY)
Admission: EM | Admit: 2019-07-27 | Discharge: 2019-07-27 | Disposition: A | Discharge status: Home / Self Care | Payer: Self-pay

## 2019-07-27 ENCOUNTER — Other Ambulatory Visit: Payer: Self-pay

## 2019-07-27 ENCOUNTER — Encounter (HOSPITAL_COMMUNITY): Payer: Self-pay | Admitting: Emergency Medicine

## 2019-07-27 DIAGNOSIS — R21 Rash and other nonspecific skin eruption: Secondary | ICD-10-CM

## 2019-07-27 MED ORDER — PREDNISONE 10 MG (21) PO TBPK: ORAL_TABLET | Freq: Every day | ORAL | 0 refills | Status: DC

## 2019-07-27 NOTE — Discharge Instructions (Signed)
Take the prednisone pack as prescribed  Follow up as needed for continued or worsening symptoms

## 2019-07-27 NOTE — ED Triage Notes (Signed)
PT has a rash on both arms. He has tried to treat it with OTC ringworm med, but it has not improved. Has been seen for same in the past.

## 2019-07-27 NOTE — ED Provider Notes (Signed)
[facility]    CSN: [id number] Arrival date & time: 07/27/19  6294      History   Chief Complaint Chief Complaint  Patient presents with  . Rash    HPI Louis Hoffman is a 34 y.o. male.   Patient is a 34 year old male who presents today for chronic recurrent rash.  This episode has been present for the past couple weeks.  Has been using over-the-counter topical medication without much relief.  Has been seen here multiple times for same previously.  Last time was treated with prednisone taper which he reports cleared the rash.  The rash is itchy.  Denies any fever, joint pain. Denies any recent changes in lotions, detergents, foods or other possible irritants. No recent travel. Nobody else at home has the rash. Patient has been outside but denies any contact with plants or insects. No new foods or medications.   ROS per HPI      Past Medical History:  Diagnosis Date  . CAP (community acquired pneumonia) 08/10/2016  . Chronic lower back pain   . GERD (gastroesophageal reflux disease)   . Legionella pneumonia (HCC) 08/13/2016    Patient Active Problem List   Diagnosis Date Noted  . Pneumonia 08/13/2016  . Legionella pneumonia (HCC) 08/13/2016  . SIRS (systemic inflammatory response syndrome) (HCC) 08/13/2016  . Respiratory distress 08/13/2016  . Lobar pneumonia (HCC) 08/11/2016  . Intractable vomiting 08/11/2016  . Hyperbilirubinemia 08/11/2016  . Hyponatremia 08/11/2016  . Tobacco abuse 08/11/2016  . Cannabis abuse 08/11/2016  . Sepsis (HCC) 08/10/2016  . Leukocytosis 08/10/2016  . Hypokalemia 08/10/2016    History reviewed. No pertinent surgical history.     Home Medications    Prior to Admission medications   Medication Sig Start Date End Date Taking? Authorizing Provider  omeprazole (PRILOSEC) 10 MG capsule Take 10 mg by mouth daily.   Yes [provider]  acetaminophen (TYLENOL) 325 MG tablet Take 2 tablets (650 mg total) by mouth  every 4 (four) hours as needed for mild pain, fever or headache. 08/14/16   Zannie Cove, MD  levofloxacin (LEVAQUIN) 500 MG tablet Take 1 tablet (500 mg total) by mouth daily. For 7days 08/14/16   Zannie Cove, MD  mupirocin cream (BACTROBAN) 2 % Apply 1 application topically 2 (two) times daily. 06/24/18   Georgetta Haber, NP  predniSONE (STERAPRED UNI-PAK 21 TAB) 10 MG (21) TBPK tablet Take by mouth daily. Take as directed. 07/27/19   Janace Aris, NP    Family History Family History  Problem Relation Age of Onset  . Hypertension Mother   . Gout Father   . Hypertension Father     Social History Social History   Tobacco Use  . Smoking status: Current Every Day Smoker    Packs/day: 1.50    Years: 15.00    Pack years: 22.50    Types: Cigarettes  . Smokeless tobacco: Never Used  Substance Use Topics  . Alcohol use: No  . Drug use: Yes    Types: Marijuana    Comment: 08/13/2016 "4-6 times/day"     Allergies   Other   Review of Systems Review of Systems   Physical Exam Triage Vital Signs ED Triage Vitals  Enc Vitals Group     BP 07/27/19 0926 (!) 136/108     Pulse Rate 07/27/19 0926 93     Resp 07/27/19 0926 16     Temp 07/27/19 0926 98.2 F (36.8 C)     Temp  Source 07/27/19 0926 Oral     SpO2 07/27/19 0926 97 %     Weight --      Height --      Head Circumference --      Peak Flow --      Pain Score 07/27/19 0923 0     Pain Loc --      Pain Edu? --      Excl. in [place]? --    No data found.  Updated Vital Signs BP (!) 136/108   Pulse 93   Temp 98.2 F (36.8 C) (Oral)   Resp 16   SpO2 97%   Visual Acuity Right Eye Distance:   Left Eye Distance:   Bilateral Distance:    Right Eye Near:   Left Eye Near:    Bilateral Near:     Physical Exam Vitals and nursing note reviewed.  Constitutional:      Appearance: Normal appearance.  HENT:     Head: Normocephalic and atraumatic.     Nose: Nose normal.  Eyes:     Conjunctiva/sclera: Conjunctivae  normal.  Pulmonary:     Effort: Pulmonary effort is normal.  Musculoskeletal:        General: Normal range of motion.     Cervical back: Normal range of motion.  Skin:    General: Skin is warm and dry.     Findings: Rash present.     Comments: See picture for detail   Neurological:     Mental Status: He is alert.  Psychiatric:        Mood and Affect: Mood normal.          UC Treatments / Results  Labs (all labs ordered are listed, but only abnormal results are displayed) Labs Reviewed - No data to display  EKG   Radiology No results found.  Procedures Procedures (including critical care time)  Medications Ordered in UC Medications - No data to display  Initial Impression / Assessment and Plan / UC Course  I have reviewed the triage vital signs and the nursing notes.  Pertinent labs & imaging results that were available during my care of the patient were reviewed by me and considered in my medical decision making (see chart for details).     Rash Discussed with Dr. Mannie Stabile.  Patient was treated previously with prednisone taper.  Will be prescribed prednisone taper to see if this helps. Most likely some sort of dermatitis. Zyrtec or Benadryl as needed for itching. Follow up as needed for continued or worsening symptoms  Final Clinical Impressions(s) / UC Diagnoses   Final diagnoses:  Rash     Discharge Instructions     Take the prednisone pack as prescribed  Follow up as needed for continued or worsening symptoms     ED Prescriptions    Medication Sig Dispense Auth. Provider   predniSONE (STERAPRED UNI-PAK 21 TAB) 10 MG (21) TBPK tablet Take by mouth daily. Take as directed. 21 tablet Oluwadamilare Tobler A, NP     PDMP not reviewed this encounter.   Orvan July, NP 07/30/19 1009

## 2022-12-02 DIAGNOSIS — M129 Arthropathy, unspecified: Secondary | ICD-10-CM | POA: Diagnosis not present

## 2023-07-27 ENCOUNTER — Telehealth: Payer: Self-pay

## 2023-07-27 NOTE — Telephone Encounter (Signed)
 Patient needs NP appointment.   Copied from CRM [id number]. Topic: Appointments - Scheduling Inquiry for Clinic >> Jul 27, 2023 12:52 PM Hamp Levine R wrote: Reason for CRM: Patient is looking to reschedule his appointment on 06/06. Scheduled with clinical pharmacist, but states it should have been with a PCP. Is requesting a callback from the office directly. Patient can be reached at [phone]

## 2023-07-27 NOTE — Telephone Encounter (Signed)
 Noted

## 2023-08-06 ENCOUNTER — Ambulatory Visit: Admitting: Pharmacist

## 2023-10-01 ENCOUNTER — Ambulatory Visit: Admitting: Physician Assistant

## 2023-10-01 ENCOUNTER — Ambulatory Visit: Admitting: Family Medicine

## 2023-11-02 ENCOUNTER — Ambulatory Visit (HOSPITAL_BASED_OUTPATIENT_CLINIC_OR_DEPARTMENT_OTHER): Admitting: Family Medicine
